# Patient Record
Sex: Female | Born: 1970 | Race: White | Hispanic: Yes | Marital: Married | Smoking: Never smoker
Health system: Southern US, Community
[De-identification: ages and names within clinical notes are randomized; demographics above are authoritative.]

## PROBLEM LIST (undated history)

## (undated) DIAGNOSIS — D259 Leiomyoma of uterus, unspecified: Secondary | ICD-10-CM

## (undated) DIAGNOSIS — M1990 Unspecified osteoarthritis, unspecified site: Secondary | ICD-10-CM

## (undated) DIAGNOSIS — E785 Hyperlipidemia, unspecified: Secondary | ICD-10-CM

## (undated) DIAGNOSIS — L719 Rosacea, unspecified: Secondary | ICD-10-CM

## (undated) HISTORY — PX: BREAST BIOPSY: SHX20

## (undated) HISTORY — DX: Rosacea, unspecified: L71.9

## (undated) HISTORY — PX: BREAST SURGERY: SHX581

## (undated) HISTORY — PX: UTERINE FIBROID EMBOLIZATION: SHX825

## (undated) HISTORY — PX: HAND SURGERY: SHX662

## (undated) HISTORY — DX: Hyperlipidemia, unspecified: E78.5

## (undated) HISTORY — DX: Leiomyoma of uterus, unspecified: D25.9

## (undated) HISTORY — DX: Unspecified osteoarthritis, unspecified site: M19.90

## (undated) HISTORY — PX: FRACTURE SURGERY: SHX138

---

## 2008-06-23 ENCOUNTER — Ambulatory Visit: Payer: Self-pay | Admitting: Gastroenterology

## 2008-06-23 DIAGNOSIS — K3189 Other diseases of stomach and duodenum: Secondary | ICD-10-CM

## 2008-06-23 DIAGNOSIS — R1013 Epigastric pain: Secondary | ICD-10-CM

## 2011-05-09 ENCOUNTER — Ambulatory Visit (INDEPENDENT_AMBULATORY_CARE_PROVIDER_SITE_OTHER): Payer: BC Managed Care – PPO | Admitting: Family Medicine

## 2011-05-09 VITALS — BP 115/76 | HR 84 | Temp 98.3°F | Resp 16 | Ht 62.0 in | Wt 125.0 lb

## 2011-05-09 DIAGNOSIS — E7800 Pure hypercholesterolemia, unspecified: Secondary | ICD-10-CM

## 2011-05-09 DIAGNOSIS — Z0000 Encounter for general adult medical examination without abnormal findings: Secondary | ICD-10-CM

## 2011-05-09 DIAGNOSIS — K602 Anal fissure, unspecified: Secondary | ICD-10-CM

## 2011-05-09 LAB — COMPREHENSIVE METABOLIC PANEL
ALT: 11 U/L (ref 0–35)
AST: 17 U/L (ref 0–37)
CO2: 29 mEq/L (ref 19–32)
Chloride: 103 mEq/L (ref 96–112)
Sodium: 138 mEq/L (ref 135–145)
Total Bilirubin: 0.5 mg/dL (ref 0.3–1.2)
Total Protein: 6.8 g/dL (ref 6.0–8.3)

## 2011-05-09 LAB — CBC
HCT: 46.9 % — ABNORMAL HIGH (ref 36.0–46.0)
Hemoglobin: 15.3 g/dL — ABNORMAL HIGH (ref 12.0–15.0)
MCH: 30.1 pg (ref 26.0–34.0)
MCHC: 32.6 g/dL (ref 30.0–36.0)
MCV: 92.3 fL (ref 78.0–100.0)
Platelets: 355 K/uL (ref 150–400)
RBC: 5.08 MIL/uL (ref 3.87–5.11)
RDW: 13.1 % (ref 11.5–15.5)
WBC: 10.9 K/uL — ABNORMAL HIGH (ref 4.0–10.5)

## 2011-05-09 LAB — LIPID PANEL
Cholesterol: 195 mg/dL (ref 0–200)
LDL Cholesterol: 114 mg/dL — ABNORMAL HIGH (ref 0–99)
VLDL: 29 mg/dL (ref 0–40)

## 2011-05-09 LAB — TSH: TSH: 2.735 u[IU]/mL (ref 0.350–4.500)

## 2011-05-09 NOTE — Progress Notes (Signed)
  Subjective:    Patient ID: [facility], female    DOB: 28-Oct-1970, 41 y.o.   MRN: [id number]  HPI 41 yo healthy female here for physical exam with labwork.  Concerned about cholesterol levels.  1 yr ago at [facility] it was high.  Has been trying to eat less cheese. Does have family history of high cholesterol but denies heart disease.  Fasting today.   Pap smear - 2012 - Mar Mammogram - 2012 - Sep or Oct  C/O: Rectal bleeding/hemorrhoids - also itches.  Uses hydrocortisone ointment and helps the itching.  Does struggle with constipation.  Stools are hard and small pieces, though goes frequently.    Review of Systems Negative except as per HPI     Objective:   Physical Exam  Constitutional: Vital signs are normal. She appears well-developed and well-nourished.  HENT:  Head: Normocephalic.  Right Ear: Hearing, tympanic membrane, external ear and ear canal normal.  Left Ear: Hearing, tympanic membrane, external ear and ear canal normal.  Nose: Nose normal.  Eyes: Conjunctivae, EOM and lids are normal. Pupils are equal, round, and reactive to light.  Neck: No mass and no thyromegaly present.  Cardiovascular: Normal rate, regular rhythm, normal heart sounds, intact distal pulses and normal pulses.   Pulmonary/Chest: Effort normal and breath sounds normal.  Abdominal: Soft. Normal appearance and bowel sounds are normal. She exhibits no distension. There is no hepatosplenomegaly. There is no tenderness. There is no CVA tenderness.  Genitourinary:          Musculoskeletal: Normal range of motion.  Lymphadenopathy:    She has no cervical adenopathy.    She has no axillary adenopathy.  Neurological: She is alert.    Small tear at anus.  No external hemorrhoids      Assessment & Plan:  Physical exam - normal exam.  Routine labs  Anal pruritis/blood - small fissure.  Colace for constipation.  INcrease water consumption.

## 2011-09-23 ENCOUNTER — Ambulatory Visit (INDEPENDENT_AMBULATORY_CARE_PROVIDER_SITE_OTHER): Payer: BC Managed Care – PPO | Admitting: Emergency Medicine

## 2011-09-23 VITALS — BP 118/74 | HR 72 | Temp 98.2°F | Resp 18 | Ht 62.0 in | Wt 132.0 lb

## 2011-09-23 DIAGNOSIS — R300 Dysuria: Secondary | ICD-10-CM

## 2011-09-23 DIAGNOSIS — R350 Frequency of micturition: Secondary | ICD-10-CM

## 2011-09-23 DIAGNOSIS — N3000 Acute cystitis without hematuria: Secondary | ICD-10-CM

## 2011-09-23 LAB — POCT URINALYSIS DIPSTICK
Bilirubin, UA: NEGATIVE
Glucose, UA: 250
Ketones, UA: NEGATIVE
Nitrite, UA: NEGATIVE
Protein, UA: NEGATIVE
Spec Grav, UA: 1.015
Urobilinogen, UA: 0.2
pH, UA: 7

## 2011-09-23 LAB — POCT UA - MICROSCOPIC ONLY
Bacteria, U Microscopic: NEGATIVE
Casts, Ur, LPF, POC: NEGATIVE
Crystals, Ur, HPF, POC: NEGATIVE
Mucus, UA: NEGATIVE
Yeast, UA: NEGATIVE

## 2011-09-23 MED ORDER — PHENAZOPYRIDINE HCL 200 MG PO TABS: 200.0000 mg | ORAL_TABLET | Freq: Three times a day (TID) | ORAL | Status: AC | PRN

## 2011-09-23 MED ORDER — FLUCONAZOLE 150 MG PO TABS: 150.0000 mg | ORAL_TABLET | Freq: Once | ORAL | Status: AC

## 2011-09-23 MED ORDER — SULFAMETHOXAZOLE-TRIMETHOPRIM 800-160 MG PO TABS: 1.0000 | ORAL_TABLET | Freq: Two times a day (BID) | ORAL | Status: AC

## 2011-09-23 NOTE — Patient Instructions (Signed)
Infeccin del tracto urinario (Urinary Tract Infection) Las infecciones en el tracto urinario pueden comenzar en varios lugares. Una infeccin en la vejiga (cistitis), una infeccin en el rin (pielonefritis) o una infeccin en la prstata (prostatitis) son diferentes tipos de infeccin del tracto urinario. Por lo general mejoran si se los trata con antibiticos. Los antibiticos son medicamentos que matan grmenes. Tome todos los medicamentos que le han recetado hasta que se terminen. Podr sentirse bien dentro de unos das, pero DEBE TOMAR LOS MEDICAMENTOS HASTA TERMINAR EL TRATAMIENTO, de lo contrario la infeccin puede no solucionarse y luego ser ms difcil de tratar. INSTRUCCIONES PARA EL CUIDADO DOMICILIARIO  Beba gran cantidad de lquidos para mantener la orina de tono claro o color amarillo plido. Se recomienda especialmente el jugo de arndanos rojos, adems de grandes cantidades de agua.   Evite la cafena, el t y las bebidas con gas. Estas sustancias irritan la vejiga.   El alcohol puede irritar la prstata.   Utilice los medicamentos de venta libre o de prescripcin para el dolor, el malestar o la fiebre, segn se lo indique el profesional que lo asiste.  PARA PREVENIR FUTURAS INFECCIONES:  Vace la vejiga con frecuencia. Evite retener la orina durante largos perodos.   Despus de mover el intestino, las mujeres deben higienizarse la regin perineal desde adelante hacia atrs. Use cada papel tissue slo una vez.   Vace la vejiga antes y despus de tener relaciones sexuales.  OBTENER LOS RESULTADOS DE LAS PRUEBAS Durante su visita no contar con todos los resultados de los anlisis. En este caso, tenga otra entrevista con su mdico para conocerlos. No piense que el resultado es normal si no tiene noticias de su mdico o de la institucin mdica. Es importante el seguimiento de todos los resultados de los anlisis.  SOLICITE ATENCIN MDICA SI:  Siente dolor en la espalda.    El beb tiene ms de 3 meses y su temperatura rectal es de 100.5 F (38.1 C) o ms durante ms de 1 da.   Los problemas (sntomas) no mejoran en 3 das. Solicite atencin mdica antes si empeora.  SOLICITE ATENCIN MDICA DE INMEDIATO SI:  Comienza a sentir un dolor de espaldas o en la zona abdominal inferior intenso.   Comienza a sentir escalofros.   Tiene fiebre.   Su beb tiene ms de 3 meses y su temperatura rectal es de 102 F (38.9 C) o mayor.   Su beb tiene 3 meses o menos y su temperatura rectal es de 100.4 F (38 C) o mayor.   Siente nuseas o vmitos.   Tiene una sensacin continua de quemazn o molestias al orinar.  EST SEGURO QUE:  Comprende las instrucciones para el alta mdica.   Controlar su enfermedad.   Solicitar atencin mdica de inmediato segn las indicaciones.  Document Released: 10/31/2004 Document Revised: 01/10/2011 [employer] Patient Information 2012 [employer]. 

## 2011-09-23 NOTE — Progress Notes (Signed)
   Date:  09/23/2011   Name:  Ariana Edwards   DOB:  05-18-1970   MRN:  [id number] Gender: female  Age: 41 y.o.  PCP:  Adrian Saran, MD    Chief Complaint: Dysuria and Urinary Frequency   History of Present Illness:  Ariana Edwards is a 41 y.o. pleasant patient who presents with the following:  Dysuria, urgency and frequency over past few days.  No fever, chills, nausea, vomiting, vaginal discharge, back pain, abdominal pain.  Chronic dyspareunia.  History of prior UTI and candidal vaginitis.  There is no problem list on file for this patient.   No past medical history on file.  No past surgical history on file.  History  Substance Use Topics  . Smoking status: Never Smoker   . Smokeless tobacco: Not on file  . Alcohol Use: Not on file    No family history on file.  No Known Allergies  Medication list has been reviewed and updated.  No current outpatient prescriptions on file prior to visit.    Review of Systems:  As per HPI, otherwise negative.    Physical Examination: Filed Vitals:   09/23/11 1317  BP: 118/74  Pulse: 72  Temp: 98.2 F (36.8 C)  Resp: 18   Filed Vitals:   09/23/11 1317  Height: 5\' 2"  (1.575 m)  Weight: 132 lb (59.875 kg)   Body mass index is 24.14 kg/(m^2). Ideal Body Weight: Weight in (lb) to have BMI = 25: 136.4    GEN: WDWN, NAD, Non-toxic, Alert & Oriented x 3 HEENT: Atraumatic, Normocephalic.  Ears and Nose: No external deformity. EXTR: No clubbing/cyanosis/edema NEURO: Normal gait.  PSYCH: Normally interactive. Conversant. Not depressed or anxious appearing.  Calm demeanor.  Abdomen soft, not tender, no CVA tenderness  Assessment and Plan: Cystitis Septra DS Pyridium  Follow up as needed  Results for orders placed in visit on 09/23/11  POCT UA - MICROSCOPIC ONLY      Component Value Range   WBC, Ur, HPF, POC 15-20     RBC, urine, microscopic 2-3     Bacteria, U Microscopic neg     Mucus, UA neg      Epithelial cells, urine per micros 0-2     Crystals, Ur, HPF, POC neg     Casts, Ur, LPF, POC neg     Yeast, UA neg    POCT URINALYSIS DIPSTICK      Component Value Range   Color, UA light yellow     Clarity, UA cloudy     Glucose, UA 250     Bilirubin, UA neg     Ketones, UA neg     Spec Grav, UA 1.015     Blood, UA moderate     pH, UA 7.0     Protein, UA neg     Urobilinogen, UA 0.2     Nitrite, UA neg     Leukocytes, UA small (1+)       Carmelina Dane, MD

## 2011-10-02 ENCOUNTER — Ambulatory Visit (INDEPENDENT_AMBULATORY_CARE_PROVIDER_SITE_OTHER): Payer: BC Managed Care – PPO | Admitting: Family Medicine

## 2011-10-02 VITALS — BP 108/64 | HR 88 | Temp 98.3°F | Resp 18 | Ht 62.0 in | Wt 131.4 lb

## 2011-10-02 DIAGNOSIS — R591 Generalized enlarged lymph nodes: Secondary | ICD-10-CM

## 2011-10-02 DIAGNOSIS — M542 Cervicalgia: Secondary | ICD-10-CM

## 2011-10-02 DIAGNOSIS — R599 Enlarged lymph nodes, unspecified: Secondary | ICD-10-CM

## 2011-10-02 NOTE — Progress Notes (Signed)
  [facility]:  Office Visit  Chief Complaint:  Chief Complaint  Patient presents with  . Headache    swelling behind right ear, pain x 3 days    HPI: [address] Ariana Edwards is a 41 y.o. female who complains of  + swelling on right base of head since Monday. Constant pressure, associated with swelling behind the ear along neck, minimal swelling, pressure is described as discomfort and not pain. Has had recent UTI and treated with Bactrim. No prior injuries or new exercises. Denies HA, vision changes, nausea vomiting, confusion, hearing issues, dizziness, gait changes, rashes.     Past Medical History  Diagnosis Date  . Uterine fibroid   . Rosacea    No past surgical history on file. History   Social History  . Marital Status: Single    Spouse Name: N/A    Number of Children: N/A  . Years of Education: N/A   Social History Main Topics  . Smoking status: Never Smoker   . Smokeless tobacco: None  . Alcohol Use: None  . Drug Use: None  . Sexually Active: None   Other Topics Concern  . None   Social History Narrative  . None   No family history on file. No Known Allergies Prior to Admission medications   Medication Sig Start Date End Date Taking? Authorizing Provider  sulfamethoxazole-trimethoprim (BACTRIM DS,SEPTRA DS) 800-160 MG per tablet Take 1 tablet by mouth 2 (two) times daily. 09/23/11 10/03/11 Yes Phillips Odor, MD     ROS: The patient denies fevers, chills, night sweats, unintentional weight loss, chest pain, palpitations, wheezing, dyspnea on exertion, nausea, vomiting, abdominal pain, dysuria, hematuria, melena, numbness, weakness, or tingling.   All other systems have been reviewed and were otherwise negative with the exception of those mentioned in the HPI and as above.    PHYSICAL EXAM: Filed Vitals:   10/02/11 1832  BP: 108/64  Pulse: 88  Temp: 98.3 F (36.8 C)  Resp: 18   Filed Vitals:   10/02/11 1832  Height: 5\' 2"   (1.575 m)  Weight: 131 lb 6.4 oz (59.603 kg)   Body mass index is 24.03 kg/(m^2).  General: Alert, no acute distress HEENT:  Normocephalic, atraumatic, oropharynx patent. + tnederness at base of right lateral occipit where trapezius and capitus msk intersects with boney prominence of occiput Cardiovascular:  Regular rate and rhythm, no rubs murmurs or gallops.  No Carotid bruits, radial pulse intact. No pedal edema.  Respiratory: Clear to auscultation bilaterally.  No wheezes, rales, or rhonchi.  No cyanosis, no use of accessory musculature GI: No organomegaly, abdomen is soft and non-tender, positive bowel sounds.  No masses. Skin: No rashes. Neurologic: Facial musculature symmetric. CN 2-12 grossly intact Psychiatric: Patient is appropriate throughout our interaction. Lymphatic: + small 2-5 mm , tender, mobile post auricular cervical lymphadenopathy on right side  Musculoskeletal: Gait intact.   LABS:    EKG/XRAY:   Primary read interpreted by Dr. Conley Rolls at [facility].   ASSESSMENT/PLAN: Encounter Diagnoses  Name Primary?  Marland Kitchen LAD (lymphadenopathy) Yes  . Cervical muscle pain    LAD secondary to recent infection Occipital cervical msk sprain/strain .  Continue to monitor. Take OTC NSAID. If discomfort does not improve with NSAID or LAD increases in size or have worsening sxs then f/u prn    LE, THAO PHUONG, DO 10/02/2011 7:16 PM

## 2012-03-02 ENCOUNTER — Ambulatory Visit (INDEPENDENT_AMBULATORY_CARE_PROVIDER_SITE_OTHER): Payer: BC Managed Care – PPO | Admitting: Physician Assistant

## 2012-03-02 VITALS — BP 124/88 | HR 80 | Temp 98.1°F | Resp 18 | Ht 61.5 in | Wt 130.0 lb

## 2012-03-02 DIAGNOSIS — L293 Anogenital pruritus, unspecified: Secondary | ICD-10-CM

## 2012-03-02 DIAGNOSIS — R350 Frequency of micturition: Secondary | ICD-10-CM

## 2012-03-02 DIAGNOSIS — R81 Glycosuria: Secondary | ICD-10-CM

## 2012-03-02 DIAGNOSIS — N390 Urinary tract infection, site not specified: Secondary | ICD-10-CM

## 2012-03-02 DIAGNOSIS — N760 Acute vaginitis: Secondary | ICD-10-CM

## 2012-03-02 DIAGNOSIS — N898 Other specified noninflammatory disorders of vagina: Secondary | ICD-10-CM

## 2012-03-02 LAB — POCT UA - MICROSCOPIC ONLY: Crystals, Ur, HPF, POC: NEGATIVE

## 2012-03-02 LAB — POCT WET PREP WITH KOH
KOH Prep POC: NEGATIVE
RBC Wet Prep HPF POC: NEGATIVE
Yeast Wet Prep HPF POC: NEGATIVE

## 2012-03-02 LAB — POCT URINALYSIS DIPSTICK
Bilirubin, UA: NEGATIVE
Glucose, UA: 100
Nitrite, UA: NEGATIVE
Urobilinogen, UA: 0.2

## 2012-03-02 LAB — GLUCOSE, POCT (MANUAL RESULT ENTRY): POC Glucose: 66 mg/dl — AB (ref 70–99)

## 2012-03-02 MED ORDER — METRONIDAZOLE 500 MG PO TABS: 500.0000 mg | ORAL_TABLET | Freq: Two times a day (BID) | ORAL | Status: DC

## 2012-03-02 NOTE — Progress Notes (Signed)
[address], [place] [address]   Phone [phone]  Subjective:    Patient ID: Ariana Edwards, female    DOB: Jul 13, 1970, 42 y.o.   MRN: [id number]  HPI Pt presents to clinic with several day h/o urinary urgency but no dysuria.  She has some vaginal irritation that she took a Diflucan for last night but it did not seem to help and she is worried she might have BV.  She has some abd fullness and discomfort.  She has noticed that for a while she has had some discomfort with sex,  She has had no new sexual partners and they have tried many different positions and she still has pain. Her pain is deep and they do use lubrication.   Review of Systems  Constitutional: Negative for fever and chills.  Gastrointestinal: Positive for abdominal pain (fullness, pressure).  Genitourinary: Positive for urgency, vaginal discharge and vaginal pain. Negative for dysuria, frequency, hematuria and vaginal bleeding (short menses that are regular).  Musculoskeletal: Negative for back pain.       Objective:   Physical Exam  Vitals reviewed. Constitutional: She appears well-developed and well-nourished.  HENT:  Head: Normocephalic and atraumatic.  Right Ear: External ear normal.  Left Ear: External ear normal.  Cardiovascular: Normal rate, regular rhythm and normal heart sounds.   No murmur heard. Pulmonary/Chest: Effort normal and breath sounds normal.  Abdominal: Soft. Bowel sounds are normal. There is no tenderness. There is no CVA tenderness.  Genitourinary: Pelvic exam was performed with patient supine. No labial fusion. There is no rash, tenderness, lesion or injury on the right labia. There is no rash, tenderness, lesion or injury on the left labia. Uterus is enlarged and tender (cervix is incomfrtable with palpation - pt states much like she feels during sex). Uterus is not deviated and not fixed. Cervix exhibits no motion tenderness, no discharge and no friability. Right adnexum  displays no mass, no tenderness and no fullness. Left adnexum displays no mass, no tenderness and no fullness. No erythema, tenderness or bleeding around the vagina. No foreign body around the vagina. No signs of injury around the vagina. Vaginal discharge (thin white d/c) found.    Results for orders placed in visit on 03/02/12  POCT URINALYSIS DIPSTICK      Component Value Range   Color, UA yellow     Clarity, UA hazy     Glucose, UA 100     Bilirubin, UA neg     Ketones, UA neg     Spec Grav, UA 1.015     Blood, UA moderate     pH, UA 5.5     Protein, UA neg     Urobilinogen, UA 0.2     Nitrite, UA neg     Leukocytes, UA Trace    POCT UA - MICROSCOPIC ONLY      Component Value Range   WBC, Ur, HPF, POC 5-7     RBC, urine, microscopic 1-3     Bacteria, U Microscopic 1+     Mucus, UA neg     Epithelial cells, urine per micros 0-1     Crystals, Ur, HPF, POC neg     Casts, Ur, LPF, POC neg     Yeast, UA neg    POCT WET PREP WITH KOH      Component Value Range   Trichomonas, UA Negative     Clue Cells Wet Prep HPF POC 100%     Epithelial  Wet Prep HPF POC 0-5     Yeast Wet Prep HPF POC neg     Bacteria Wet Prep HPF POC 4+     RBC Wet Prep HPF POC neg     WBC Wet Prep HPF POC 0-3     KOH Prep POC Negative    GLUCOSE, POCT (MANUAL RESULT ENTRY)      Component Value Range   POC Glucose 66 (*) 70 - 99 mg/dl       Assessment & Plan:   1. Urinary frequency  POCT urinalysis dipstick, POCT UA - Microscopic Only, Urine culture  2. Vaginal itching  POCT Wet Prep with KOH  3. Glucosuria  POCT glucose (manual entry)  4. BV (bacterial vaginosis)  metroNIDAZOLE (FLAGYL) 500 MG tablet   1- will treat for BV because we see that definitely on wet prep and will send urine culture - there is a possibility that the patient has both infections but the urine is not as clear - pt would like to wait on that treatment until the culture shows definite infection 2- no yeast seen -  3- pt has  had glucose i her last 2 urine samples but her blood glucose is normal 4- treat current  D/w pt that she has a probable fibroid that is causing her pain - she also has a short vaginal canal and if she changes positions to limit her partners depth of penetration that may decrease her pain.  Pt will f/u with Dr Juliene Pina (her GYN) about her possible fibroid.

## 2012-03-05 LAB — URINE CULTURE

## 2012-03-06 MED ORDER — CIPROFLOXACIN HCL 500 MG PO TABS: 500.0000 mg | ORAL_TABLET | Freq: Two times a day (BID) | ORAL | Status: DC

## 2012-03-06 NOTE — Addendum Note (Signed)
Addended by: Morrell Riddle on: 03/06/2012 12:24 PM   Modules accepted: Orders

## 2012-10-14 ENCOUNTER — Ambulatory Visit: Payer: Self-pay | Admitting: *Deleted

## 2012-11-15 ENCOUNTER — Ambulatory Visit (INDEPENDENT_AMBULATORY_CARE_PROVIDER_SITE_OTHER): Payer: BC Managed Care – PPO | Admitting: Physician Assistant

## 2012-11-15 VITALS — BP 104/70 | HR 75 | Temp 97.5°F | Resp 16 | Ht 61.5 in | Wt 131.0 lb

## 2012-11-15 DIAGNOSIS — N390 Urinary tract infection, site not specified: Secondary | ICD-10-CM

## 2012-11-15 DIAGNOSIS — N898 Other specified noninflammatory disorders of vagina: Secondary | ICD-10-CM

## 2012-11-15 DIAGNOSIS — R350 Frequency of micturition: Secondary | ICD-10-CM

## 2012-11-15 DIAGNOSIS — N841 Polyp of cervix uteri: Secondary | ICD-10-CM

## 2012-11-15 LAB — POCT URINALYSIS DIPSTICK
Bilirubin, UA: NEGATIVE
Glucose, UA: 100
Nitrite, UA: NEGATIVE
Spec Grav, UA: <=1.005
pH, UA: 6

## 2012-11-15 LAB — POCT WET PREP WITH KOH

## 2012-11-15 LAB — POCT UA - MICROSCOPIC ONLY
Casts, Ur, LPF, POC: NEGATIVE
Yeast, UA: NEGATIVE

## 2012-11-15 MED ORDER — NITROFURANTOIN MONOHYD MACRO 100 MG PO CAPS: 100.0000 mg | ORAL_CAPSULE | Freq: Two times a day (BID) | ORAL | Status: AC

## 2012-11-15 MED ORDER — FLUCONAZOLE 150 MG PO TABS: 150.0000 mg | ORAL_TABLET | Freq: Once | ORAL | Status: DC

## 2012-11-15 MED ORDER — PHENAZOPYRIDINE HCL 200 MG PO TABS: 200.0000 mg | ORAL_TABLET | Freq: Three times a day (TID) | ORAL | Status: DC | PRN

## 2012-11-15 NOTE — Patient Instructions (Addendum)
Endocervical polyp -- call Dr Juliene Pina to schedule an appt for her to evaluate  If after the antibiotic for the urinary infection you still have vaginal itching please use the diflucan - if the vaginal symptoms continue then return to clinic

## 2012-11-15 NOTE — Progress Notes (Signed)
[address], [place] [address]   Phone [phone]  Subjective:    Patient ID: [facility], female    DOB: August 26, 1970, 42 y.o.   MRN: [id number]  HPI Pt presents to clinic with dysuria and urinary urgency and frequency.  She is having some vaginal discharge but more like mucus but with itching - she used a Diflucan yesterday am but she is still having the itching symptoms.  She has had no change in sexual partners.  Review of Systems  Constitutional: Negative for fever and chills.  Gastrointestinal: Negative for nausea, vomiting and abdominal pain.  Genitourinary: Positive for dysuria, urgency, frequency and vaginal discharge (white - like mucus -- vaginal itching).  Musculoskeletal: Negative for back pain.       Objective:   Physical Exam  Vitals reviewed. Constitutional: She is oriented to person, place, and time. She appears well-developed and well-nourished.  HENT:  Head: Normocephalic and atraumatic.  Right Ear: External ear normal.  Left Ear: External ear normal.  Eyes: Conjunctivae are normal. Pupils are equal, round, and reactive to light.  Cardiovascular: Normal rate, regular rhythm and normal heart sounds.   No murmur heard. Pulmonary/Chest: Effort normal and breath sounds normal.  Abdominal: Soft.  Genitourinary: Vagina normal. No labial fusion. There is no rash, tenderness, lesion or injury on the right labia. There is no rash, tenderness, lesion or injury on the left labia. Uterus is enlarged (slightly). Uterus is not deviated, not fixed and not tender. Cervix exhibits discharge (mucus discharge). Right adnexum displays no mass, no tenderness and no fullness. Left adnexum displays no mass, no tenderness and no fullness.    Neurological: She is alert and oriented to person, place, and time.  Skin: Skin is warm and dry.  Psychiatric: She has a normal mood and affect. Her behavior is normal. Judgment and thought content normal.   Results for orders  placed in visit on 11/15/12  POCT UA - MICROSCOPIC ONLY      Result Value Range   WBC, Ur, HPF, POC tntc     RBC, urine, microscopic tntc     Bacteria, U Microscopic 1+     Mucus, UA trace     Epithelial cells, urine per micros 0-3     Crystals, Ur, HPF, POC neg     Casts, Ur, LPF, POC neg     Yeast, UA neg    POCT URINALYSIS DIPSTICK      Result Value Range   Color, UA lt yellow     Clarity, UA cloudy     Glucose, UA 100     Bilirubin, UA neg     Ketones, UA neg     Spec Grav, UA <=1.005     Blood, UA large     pH, UA 6.0     Protein, UA neg     Urobilinogen, UA 0.2     Nitrite, UA neg     Leukocytes, UA large (3+)    POCT WET PREP WITH KOH      Result Value Range   Trichomonas, UA Negative     Clue Cells Wet Prep HPF POC 0-8     Epithelial Wet Prep HPF POC 6-21     Yeast Wet Prep HPF POC neg     Bacteria Wet Prep HPF POC 3+     RBC Wet Prep HPF POC 0-1     WBC Wet Prep HPF POC 0-3     KOH Prep POC Negative  Assessment & Plan:  Urinary frequency - Plan: POCT UA - Microscopic Only, POCT urinalysis dipstick, Urine culture  Vaginal discharge - Wet prep is neg for yeast but gave her diflucan due to abx for UTI - she will RTC if in 2 weeks she is still having vaginal symptoms after treatment of UTI and yeast vaginitis.  Plan: POCT Wet Prep with KOH, fluconazole (DIFLUCAN) 150 MG tablet  Cervical polyp - pt will call Dr Juliene Pina for eval.  UTI (urinary tract infection) - Treat with abx - pt to push fluids - Plan: nitrofurantoin, macrocrystal-monohydrate, (MACROBID) 100 MG capsule, phenazopyridine (PYRIDIUM) 200 MG tablet  Benny Lennert PA-C 11/15/2012 3:13 PM

## 2013-05-13 ENCOUNTER — Ambulatory Visit (INDEPENDENT_AMBULATORY_CARE_PROVIDER_SITE_OTHER): Payer: BC Managed Care – PPO | Admitting: Physician Assistant

## 2013-05-13 VITALS — BP 106/64 | HR 96 | Temp 98.4°F | Resp 18 | Ht 61.5 in | Wt 129.0 lb

## 2013-05-13 DIAGNOSIS — R0981 Nasal congestion: Secondary | ICD-10-CM

## 2013-05-13 DIAGNOSIS — H6690 Otitis media, unspecified, unspecified ear: Secondary | ICD-10-CM

## 2013-05-13 DIAGNOSIS — J3489 Other specified disorders of nose and nasal sinuses: Secondary | ICD-10-CM

## 2013-05-13 DIAGNOSIS — J309 Allergic rhinitis, unspecified: Secondary | ICD-10-CM

## 2013-05-13 MED ORDER — IPRATROPIUM BROMIDE 0.03 % NA SOLN: 2.0000 | Freq: Two times a day (BID) | NASAL | Status: DC

## 2013-05-13 MED ORDER — AMOXICILLIN 875 MG PO TABS: 875.0000 mg | ORAL_TABLET | Freq: Two times a day (BID) | ORAL | Status: DC

## 2013-05-13 MED ORDER — METHYLPREDNISOLONE (PAK) 4 MG PO TABS: ORAL_TABLET | ORAL | Status: DC

## 2013-05-14 NOTE — Progress Notes (Signed)
   Subjective:    Patient ID: Ariana Edwards, female    DOB: 12-Jun-1970, 43 y.o.   MRN: [id number]  HPI 43 year old female presents for evaluation of acute onset of runny nose, sore throat, PND, itchy, watery eyes, and clear rhinorrhea.  Also complains of right ear pain. Symptoms started yesterday and are worse today. 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  No hx of seasonal allergies - has lived in [place] for 8 years. Her husband does have allergies and takes antihistamines daily in the spring/fall Denies fever, chills, nausea, vomiting, headache, dizziness, cough, SOB, wheezing, or chest pain.   Patient is otherwise doing well with no other concerns today.     Review of Systems  Constitutional: Negative for fever and chills.  HENT: Positive for congestion, ear pain (right), postnasal drip, rhinorrhea, sinus pressure, sneezing and sore throat. Negative for trouble swallowing.   Respiratory: Negative for cough, shortness of breath and wheezing.   Cardiovascular: Negative for chest pain.  Gastrointestinal: Negative for nausea and vomiting.  Neurological: Negative for dizziness and headaches.       Objective:   Physical Exam  Constitutional: She is oriented to person, place, and time. She appears well-developed and well-nourished.  HENT:  Head: Normocephalic and atraumatic.  Right Ear: Hearing, external ear and ear canal normal. Tympanic membrane is erythematous. A middle ear effusion is present.  Left Ear: Hearing, tympanic membrane, external ear and ear canal normal.  Mouth/Throat: Uvula is midline, oropharynx is clear and moist and mucous membranes are normal.  Eyes: Right conjunctiva is injected. Left conjunctiva is injected.  Bilateral watery drainage   Neck: Normal range of motion. Neck supple.  Cardiovascular: Normal rate, regular rhythm and normal heart sounds.   Pulmonary/Chest: Effort normal and breath sounds normal.  Lymphadenopathy:     She has no cervical adenopathy.  Neurological: She is alert and oriented to person, place, and time.  Psychiatric: She has a normal mood and affect. Her behavior is normal. Judgment and thought content normal.          Assessment & Plan:  Allergic rhinitis - Plan: methylPREDNIsolone (MEDROL DOSPACK) 4 MG tablet  Otitis media - Plan: amoxicillin (AMOXIL) 875 MG tablet  Nasal congestion - Plan: methylPREDNIsolone (MEDROL DOSPACK) 4 MG tablet, ipratropium (ATROVENT) 0.03 % nasal spray  Allergic rhinitis with AOM. Will treat ear infection with amoxicillin 875 mg bid x 10 days. Continue OTC Allegra daily. Start medrol dose pack tomorrow. Use atrovent NS twice daily for PND and congestion.  Follow up if symptoms worsen or fail to improve.

## 2013-06-09 ENCOUNTER — Other Ambulatory Visit: Payer: Self-pay | Admitting: Physician Assistant

## 2013-09-10 ENCOUNTER — Ambulatory Visit (INDEPENDENT_AMBULATORY_CARE_PROVIDER_SITE_OTHER): Payer: BC Managed Care – PPO | Admitting: Physician Assistant

## 2013-09-10 VITALS — BP 112/60 | HR 87 | Temp 98.0°F | Resp 16 | Ht 62.0 in | Wt 128.0 lb

## 2013-09-10 DIAGNOSIS — Z131 Encounter for screening for diabetes mellitus: Secondary | ICD-10-CM

## 2013-09-10 DIAGNOSIS — R350 Frequency of micturition: Secondary | ICD-10-CM

## 2013-09-10 DIAGNOSIS — R300 Dysuria: Secondary | ICD-10-CM

## 2013-09-10 DIAGNOSIS — N3000 Acute cystitis without hematuria: Secondary | ICD-10-CM

## 2013-09-10 LAB — POCT URINALYSIS DIPSTICK
Bilirubin, UA: NEGATIVE
Blood, UA: <=1.005
Glucose, UA: 500
Ketones, UA: NEGATIVE
Nitrite, UA: NEGATIVE
Protein, UA: NEGATIVE
Urobilinogen, UA: 0.2
pH, UA: 5.5

## 2013-09-10 LAB — POCT UA - MICROSCOPIC ONLY
Bacteria, U Microscopic: NEGATIVE
Casts, Ur, LPF, POC: NEGATIVE
Crystals, Ur, HPF, POC: NEGATIVE
Mucus, UA: NEGATIVE
Yeast, UA: NEGATIVE

## 2013-09-10 LAB — POCT GLYCOSYLATED HEMOGLOBIN (HGB A1C): Hemoglobin A1C: 5.8

## 2013-09-10 LAB — GLUCOSE, POCT (MANUAL RESULT ENTRY): POC Glucose: 83 mg/dl (ref 70–99)

## 2013-09-10 MED ORDER — CIPROFLOXACIN HCL 250 MG PO TABS: 250.0000 mg | ORAL_TABLET | Freq: Two times a day (BID) | ORAL | Status: DC

## 2013-09-10 NOTE — Progress Notes (Signed)
   Subjective:    Patient ID: [facility], female    DOB: 26-Sep-1970, 43 y.o.   MRN: [id number]  HPI 43 year old female presents for evaluation of acute onset of urinary frequency, dysuria, and urgency. States symptoms started yesterday and have now worsened.  Also admits to bad odor to her urine and some vaginal itching. Denies nausea, vomiting, fever, chills, flank pain, hematuria, or vaginal discharge. Hx of UTI's in the past with last 10/14.  Patient is otherwise doing well with no other concerns today.    Review of Systems  Constitutional: Negative for fever and chills.  Gastrointestinal: Positive for abdominal pain (lower). Negative for nausea and vomiting.  Genitourinary: Positive for dysuria, urgency and frequency. Negative for flank pain, vaginal discharge and menstrual problem.  Neurological: Negative for headaches.       Objective:   Physical Exam  Constitutional: She is oriented to person, place, and time.  HENT:  Head: Normocephalic and atraumatic.  Right Ear: External ear normal.  Left Ear: External ear normal.  Eyes: Conjunctivae are normal.  Neck: Normal range of motion.  Cardiovascular: Normal rate.   Pulmonary/Chest: Effort normal.  Abdominal: Bowel sounds are normal. There is tenderness in the suprapubic area. There is rebound. There is no guarding and no CVA tenderness.  Neurological: She is alert and oriented to person, place, and time.  Psychiatric: She has a normal mood and affect. Her behavior is normal. Judgment and thought content normal.     Results for orders placed in visit on 09/10/13  POCT URINALYSIS DIPSTICK      Result Value Ref Range   Color, UA yellow     Clarity, UA cloudy     Glucose, UA 500     Bilirubin, UA neg     Ketones, UA neg     Spec Grav, UA       Blood, UA <=1.005     pH, UA 5.5     Protein, UA neg     Urobilinogen, UA 0.2     Nitrite, UA neg     Leukocytes, UA moderate (2+)    POCT UA - MICROSCOPIC ONLY   Result Value Ref Range   WBC, Ur, HPF, POC 20-25     RBC, urine, microscopic 0-3     Bacteria, U Microscopic neg     Mucus, UA neg     Epithelial cells, urine per micros 0-4     Crystals, Ur, HPF, POC neg     Casts, Ur, LPF, POC neg     Yeast, UA neg    GLUCOSE, POCT (MANUAL RESULT ENTRY)      Result Value Ref Range   POC Glucose 83  70 - 99 mg/dl  POCT GLYCOSYLATED HEMOGLOBIN (HGB A1C)      Result Value Ref Range   Hemoglobin A1C 5.8          Assessment & Plan:   Dysuria - Plan: POCT urinalysis dipstick, POCT UA - Microscopic Only  Screening for diabetes mellitus - Plan: POCT glucose (manual entry), POCT glycosylated hemoglobin (Hb A1C)  Urinary frequency  Acute cystitis without hematuria - Plan: Urine culture  Will treat for UTI with Cipro 250 mg bid x 5 days. Urine culture sent Push fluids.  Glucose and HgA1c normal which is reassuring. Patient will let me know if she wants a Nephrology eval.  Follow up if symptoms worsening or fail to improve.

## 2013-09-12 LAB — URINE CULTURE: Colony Count: 45000

## 2014-04-12 ENCOUNTER — Other Ambulatory Visit: Payer: Self-pay | Admitting: Obstetrics & Gynecology

## 2014-04-12 DIAGNOSIS — D259 Leiomyoma of uterus, unspecified: Secondary | ICD-10-CM

## 2014-04-12 DIAGNOSIS — D219 Benign neoplasm of connective and other soft tissue, unspecified: Secondary | ICD-10-CM

## 2014-05-04 ENCOUNTER — Other Ambulatory Visit: Payer: Self-pay | Admitting: Obstetrics & Gynecology

## 2014-05-04 ENCOUNTER — Ambulatory Visit
Admission: RE | Admit: 2014-05-04 | Discharge: 2014-05-04 | Disposition: A | Discharge status: Home / Self Care | Payer: BC Managed Care – PPO | Source: Ambulatory Visit | Attending: Obstetrics & Gynecology | Admitting: Obstetrics & Gynecology

## 2014-05-04 DIAGNOSIS — D259 Leiomyoma of uterus, unspecified: Secondary | ICD-10-CM

## 2014-05-04 HISTORY — PX: IR GENERIC HISTORICAL: IMG1180011

## 2014-05-04 NOTE — Consult Note (Signed)
Chief Complaint: Chief Complaint  Patient presents with  . Advice Only    Consult for [place]      Referring Physician(s): Mody,Vaishali  History of Present Illness: Ariana Edwards is a 44 y.o. female story of pelvic cramping and pain secondary to fibroids. In worsening over the last year. She is gravida 0 para 0. She describes cramping during and between her menstrual periods. She also complains of bloating and has a constant awareness of her fibroids. She complains that her stomach is distended. She also has occasional stress incontinence. She denies any abnormal menstrual bleeding. She also complains of dyspareunia. Regarding pregnancy, she is considering pregnancy and has agreed to hold off for 6 months after treatment, should she proceed. She denies any PID, pelvic radiation, or pelvic malignancy.  Past Medical History  Diagnosis Date  . Uterine fibroid   . Rosacea   . Hyperlipidemia     Past Surgical History  Procedure Laterality Date  . Breast surgery    . Fracture surgery      Allergies: Sulfa antibiotics  Medications: Prior to Admission medications   Medication Sig Start Date End Date Taking? Authorizing Provider  ciprofloxacin (CIPRO) 250 MG tablet Take 1 tablet (250 mg total) by mouth 2 (two) times daily. Patient not taking: Reported on 05/04/2014 09/10/13   Marsh Dolly Marte, PA-C  ipratropium (ATROVENT) 0.03 % nasal spray PLACE 2 SPRAYS INTO THE NOSE 2 (TWO) TIMES DAILY. Patient not taking: Reported on 05/04/2014    Collene Leyden, PA-C  methylPREDNIsolone (MEDROL [facility]) 4 MG tablet follow package directions Patient not taking: Reported on 05/04/2014 05/13/13   Collene Leyden, PA-C     Family History  Problem Relation Age of Onset  . Hypertension Mother   . Hypothyroidism Sister     History   Social History  . Marital Status: Single    Spouse Name: N/A  . Number of Children: N/A  . Years of Education: N/A   Social History Main Topics  .  Smoking status: Never Smoker   . Smokeless tobacco: Not on file  . Alcohol Use: Not on file  . Drug Use: Not on file  . Sexual Activity: Not on file   Other Topics Concern  . Not on file   Social History Narrative  . No narrative on file     Review of Systems: A 12 point ROS discussed and pertinent positives are indicated in the HPI above.  All other systems are negative.  Review of Systems  Vital Signs: BP 127/69 mmHg  Pulse 77  Temp(Src) 97.6 F (36.4 C) (Oral)  Resp 14  Ht 5\' 2"  (1.575 m)  Wt 124 lb (56.246 kg)  BMI 22.67 kg/m2  SpO2 99%  LMP 04/19/2014 (Approximate)  Physical Exam  Mallampati Score:     Imaging: No results found.  Ultrasound performed at an outpatient facility dated 10/29/2013 demonstrates multiple uterine fibroids, the largest of which measures greater than 6 cm.  Labs:  CBC: No results for input(s): WBC, HGB, HCT, PLT in the last 8760 hours.  COAGS: No results for input(s): INR, APTT in the last 8760 hours.  BMP: No results for input(s): NA, K, CL, CO2, GLUCOSE, BUN, CALCIUM, CREATININE, GFRNONAA, GFRAA in the last 8760 hours.  Invalid input(s): CMP  LIVER FUNCTION TESTS: No results for input(s): BILITOT, AST, ALT, ALKPHOS, PROT, ALBUMIN in the last 8760 hours.  TUMOR MARKERS: No results for input(s): AFPTM, CEA, CA199, CHROMGRNA in the  last 8760 hours.  Assessment and Plan:  [name] is a candidate for uterine fibroid embolization, given her symptoms of pelvic cramping, stress incontinence, and bulk symptoms. She has had a recent ultrasound but will require an MRI with contrast. She has had an MRI of her breast with contrast in the past and did not have problems. Her questions were answered. She will not need an endometrial biopsy.  Thank you for this interesting consult.  I greatly enjoyed meeting Ariana Edwards and look forward to participating in their care.  Signed: Sulayman Manning, ART A 05/04/2014, 3:42  PM   I spent a total of  45 Minutes   in face to face in clinical consultation, greater than 50% of which was counseling/coordinating care for uterine fibroid embolization.

## 2014-05-15 ENCOUNTER — Ambulatory Visit
Admission: RE | Admit: 2014-05-15 | Discharge: 2014-05-15 | Disposition: A | Discharge status: Home / Self Care | Payer: BC Managed Care – PPO | Source: Ambulatory Visit | Attending: Obstetrics & Gynecology | Admitting: Obstetrics & Gynecology

## 2014-05-15 DIAGNOSIS — D259 Leiomyoma of uterus, unspecified: Secondary | ICD-10-CM

## 2014-05-15 MED ORDER — GADOBENATE DIMEGLUMINE 529 MG/ML IV SOLN
10.0000 mL | Freq: Once | INTRAVENOUS | Status: AC | PRN
  Administered 2014-05-15: 10 mL via INTRAVENOUS

## 2014-07-18 ENCOUNTER — Other Ambulatory Visit: Payer: Self-pay | Admitting: Radiology

## 2014-07-19 ENCOUNTER — Other Ambulatory Visit: Payer: Self-pay | Admitting: Radiology

## 2014-07-20 ENCOUNTER — Observation Stay (HOSPITAL_COMMUNITY)
Admission: RE | Admit: 2014-07-20 | Discharge: 2014-07-21 | Disposition: A | Discharge status: Home / Self Care | Payer: BC Managed Care – PPO | Source: Ambulatory Visit | Attending: Interventional Radiology | Admitting: Interventional Radiology

## 2014-07-20 ENCOUNTER — Other Ambulatory Visit: Payer: Self-pay | Admitting: Interventional Radiology

## 2014-07-20 ENCOUNTER — Ambulatory Visit (HOSPITAL_COMMUNITY)
Admission: RE | Admit: 2014-07-20 | Discharge: 2014-07-20 | Disposition: A | Discharge status: Home / Self Care | Payer: BC Managed Care – PPO | Source: Ambulatory Visit | Attending: Interventional Radiology | Admitting: Interventional Radiology

## 2014-07-20 ENCOUNTER — Encounter (HOSPITAL_COMMUNITY): Payer: Self-pay

## 2014-07-20 VITALS — BP 114/71 | HR 98 | Temp 98.4°F | Resp 20 | Ht 62.0 in | Wt 120.0 lb

## 2014-07-20 DIAGNOSIS — D219 Benign neoplasm of connective and other soft tissue, unspecified: Secondary | ICD-10-CM

## 2014-07-20 DIAGNOSIS — R32 Unspecified urinary incontinence: Secondary | ICD-10-CM | POA: Insufficient documentation

## 2014-07-20 DIAGNOSIS — Z872 Personal history of diseases of the skin and subcutaneous tissue: Secondary | ICD-10-CM | POA: Diagnosis not present

## 2014-07-20 DIAGNOSIS — Z792 Long term (current) use of antibiotics: Secondary | ICD-10-CM | POA: Diagnosis not present

## 2014-07-20 DIAGNOSIS — Z79899 Other long term (current) drug therapy: Secondary | ICD-10-CM | POA: Insufficient documentation

## 2014-07-20 DIAGNOSIS — E785 Hyperlipidemia, unspecified: Secondary | ICD-10-CM | POA: Insufficient documentation

## 2014-07-20 DIAGNOSIS — D259 Leiomyoma of uterus, unspecified: Secondary | ICD-10-CM | POA: Diagnosis not present

## 2014-07-20 LAB — CBC WITH DIFFERENTIAL/PLATELET
Basophils Absolute: 0.1 10*3/uL (ref 0.0–0.1)
Basophils Relative: 1 % (ref 0–1)
EOS ABS: 0.4 10*3/uL (ref 0.0–0.7)
EOS PCT: 4 % (ref 0–5)
HCT: 43.4 % (ref 36.0–46.0)
HEMOGLOBIN: 14.5 g/dL (ref 12.0–15.0)
LYMPHS ABS: 3.2 10*3/uL (ref 0.7–4.0)
Lymphocytes Relative: 36 % (ref 12–46)
MCH: 29.5 pg (ref 26.0–34.0)
MCHC: 33.4 g/dL (ref 30.0–36.0)
MCV: 88.4 fL (ref 78.0–100.0)
MONOS PCT: 5 % (ref 3–12)
Monocytes Absolute: 0.4 10*3/uL (ref 0.1–1.0)
Neutro Abs: 4.9 10*3/uL (ref 1.7–7.7)
Neutrophils Relative %: 54 % (ref 43–77)
Platelets: 275 10*3/uL (ref 150–400)
RBC: 4.91 MIL/uL (ref 3.87–5.11)
RDW: 13.1 % (ref 11.5–15.5)
WBC: 8.9 10*3/uL (ref 4.0–10.5)

## 2014-07-20 LAB — BASIC METABOLIC PANEL
Anion gap: 8 (ref 5–15)
BUN: 13 mg/dL (ref 6–20)
CO2: 25 mmol/L (ref 22–32)
Calcium: 9.2 mg/dL (ref 8.9–10.3)
Chloride: 103 mmol/L (ref 101–111)
Creatinine, Ser: 0.77 mg/dL (ref 0.44–1.00)
GFR calc Af Amer: >60 mL/min (ref >60)
GFR calc non Af Amer: >60 mL/min (ref >60)
GLUCOSE: 98 mg/dL (ref 65–99)
POTASSIUM: 4 mmol/L (ref 3.5–5.1)
SODIUM: 136 mmol/L (ref 135–145)

## 2014-07-20 LAB — PROTIME-INR
INR: 0.95 (ref 0.00–1.49)
Prothrombin Time: 12.9 seconds (ref 11.6–15.2)

## 2014-07-20 LAB — HCG, SERUM, QUALITATIVE: Preg, Serum: NEGATIVE

## 2014-07-20 MED ORDER — DIPHENHYDRAMINE HCL 50 MG/ML IJ SOLN
INTRAMUSCULAR | Status: AC
  Filled 2014-07-20: qty 1

## 2014-07-20 MED ORDER — KETOROLAC TROMETHAMINE 30 MG/ML IJ SOLN
INTRAMUSCULAR | Status: AC
  Filled 2014-07-20: qty 1

## 2014-07-20 MED ORDER — PROMETHAZINE HCL 25 MG RE SUPP: 25.0000 mg | Freq: Three times a day (TID) | RECTAL | Status: DC | PRN

## 2014-07-20 MED ORDER — HYDROMORPHONE 0.3 MG/ML IV SOLN
INTRAVENOUS | Status: AC
  Filled 2014-07-20: qty 25

## 2014-07-20 MED ORDER — SODIUM CHLORIDE 0.9 % IV SOLN: 250.0000 mL | INTRAVENOUS | Status: DC | PRN

## 2014-07-20 MED ORDER — IOHEXOL 300 MG/ML  SOLN
45.0000 mL | Freq: Once | INTRAMUSCULAR | Status: AC | PRN
  Administered 2014-07-20: 45 mL via INTRAVENOUS

## 2014-07-20 MED ORDER — PROMETHAZINE HCL 25 MG PO TABS
25.0000 mg | ORAL_TABLET | Freq: Three times a day (TID) | ORAL | Status: DC | PRN
  Administered 2014-07-20: 25 mg via ORAL
  Filled 2014-07-20: qty 1

## 2014-07-20 MED ORDER — IOHEXOL 300 MG/ML  SOLN
47.0000 mL | Freq: Once | INTRAMUSCULAR | Status: AC | PRN
  Administered 2014-07-20: 47 mL via INTRA_ARTERIAL

## 2014-07-20 MED ORDER — DIPHENHYDRAMINE HCL 50 MG/ML IJ SOLN: 12.5000 mg | Freq: Four times a day (QID) | INTRAMUSCULAR | Status: DC | PRN

## 2014-07-20 MED ORDER — NITROGLYCERIN 1 MG/10 ML FOR IR/CATH LAB
INTRA_ARTERIAL | Status: AC | PRN
  Administered 2014-07-20 (×2): 100 ug via INTRA_ARTERIAL

## 2014-07-20 MED ORDER — ONDANSETRON HCL 4 MG/2ML IJ SOLN
4.0000 mg | Freq: Four times a day (QID) | INTRAMUSCULAR | Status: DC | PRN
  Administered 2014-07-20 (×2): 4 mg via INTRAVENOUS
  Filled 2014-07-20: qty 2

## 2014-07-20 MED ORDER — HYDROMORPHONE HCL 1 MG/ML IJ SOLN
INTRAMUSCULAR | Status: AC | PRN
  Administered 2014-07-20 (×2): 1 mg via INTRAVENOUS

## 2014-07-20 MED ORDER — ONDANSETRON HCL 4 MG/2ML IJ SOLN: 4.0000 mg | Freq: Four times a day (QID) | INTRAMUSCULAR | Status: DC | PRN

## 2014-07-20 MED ORDER — HYDROMORPHONE HCL 2 MG/ML IJ SOLN
INTRAMUSCULAR | Status: AC
  Filled 2014-07-20: qty 1

## 2014-07-20 MED ORDER — IOHEXOL 300 MG/ML  SOLN
20.0000 mL | Freq: Once | INTRAMUSCULAR | Status: AC | PRN
  Administered 2014-07-20: 20 mL via INTRA_ARTERIAL

## 2014-07-20 MED ORDER — DOCUSATE SODIUM 100 MG PO CAPS
100.0000 mg | ORAL_CAPSULE | Freq: Two times a day (BID) | ORAL | Status: DC
  Administered 2014-07-20 (×2): 100 mg via ORAL
  Filled 2014-07-20 (×4): qty 1

## 2014-07-20 MED ORDER — FENTANYL CITRATE (PF) 100 MCG/2ML IJ SOLN
INTRAMUSCULAR | Status: AC | PRN
Start: 2014-07-20 — End: 2014-07-20
  Administered 2014-07-20 (×3): 25 ug via INTRAVENOUS

## 2014-07-20 MED ORDER — SODIUM CHLORIDE 0.9 % IV SOLN
INTRAVENOUS | Status: DC
  Administered 2014-07-20: 08:00:00 via INTRAVENOUS

## 2014-07-20 MED ORDER — SODIUM CHLORIDE 0.9 % IJ SOLN: 9.0000 mL | INTRAMUSCULAR | Status: DC | PRN

## 2014-07-20 MED ORDER — SODIUM CHLORIDE 0.9 % IJ SOLN: 3.0000 mL | Freq: Two times a day (BID) | INTRAMUSCULAR | Status: DC

## 2014-07-20 MED ORDER — NALOXONE HCL 0.4 MG/ML IJ SOLN: 0.4000 mg | INTRAMUSCULAR | Status: DC | PRN

## 2014-07-20 MED ORDER — KETOROLAC TROMETHAMINE 30 MG/ML IJ SOLN
30.0000 mg | Freq: Once | INTRAMUSCULAR | Status: AC
  Administered 2014-07-20: 30 mg via INTRAVENOUS

## 2014-07-20 MED ORDER — DIPHENHYDRAMINE HCL 12.5 MG/5ML PO ELIX: 12.5000 mg | ORAL_SOLUTION | Freq: Four times a day (QID) | ORAL | Status: DC | PRN

## 2014-07-20 MED ORDER — CEFAZOLIN SODIUM-DEXTROSE 2-3 GM-% IV SOLR
2.0000 g | Freq: Once | INTRAVENOUS | Status: AC
  Administered 2014-07-20: 2 g via INTRAVENOUS

## 2014-07-20 MED ORDER — FENTANYL CITRATE (PF) 100 MCG/2ML IJ SOLN
INTRAMUSCULAR | Status: AC
  Filled 2014-07-20: qty 4

## 2014-07-20 MED ORDER — LIDOCAINE HCL 1 % IJ SOLN
INTRAMUSCULAR | Status: AC
  Filled 2014-07-20: qty 20

## 2014-07-20 MED ORDER — HYDROMORPHONE 0.3 MG/ML IV SOLN: INTRAVENOUS | Status: DC

## 2014-07-20 MED ORDER — MORPHINE SULFATE 1 MG/ML IV SOLN
INTRAVENOUS | Status: DC
  Administered 2014-07-20 (×2): via INTRAVENOUS
  Administered 2014-07-20: 17.47 mg via INTRAVENOUS
  Administered 2014-07-21: 10.7 mg via INTRAVENOUS
  Administered 2014-07-21: 5.3 mg via INTRAVENOUS
  Administered 2014-07-21: 0.5 mg via INTRAVENOUS
  Filled 2014-07-20 (×2): qty 25

## 2014-07-20 MED ORDER — SODIUM CHLORIDE 0.9 % IV SOLN
INTRAVENOUS | Status: DC
  Administered 2014-07-20: 16:00:00 via INTRAVENOUS

## 2014-07-20 MED ORDER — SODIUM CHLORIDE 0.9 % IJ SOLN: 3.0000 mL | INTRAMUSCULAR | Status: DC | PRN

## 2014-07-20 MED ORDER — PREDNISONE 20 MG PO TABS
20.0000 mg | ORAL_TABLET | Freq: Once | ORAL | Status: DC
  Filled 2014-07-20: qty 1

## 2014-07-20 MED ORDER — IOHEXOL 300 MG/ML  SOLN: 20.0000 mL | Freq: Once | INTRAMUSCULAR | Status: DC | PRN

## 2014-07-20 MED ORDER — PANTOPRAZOLE SODIUM 40 MG IV SOLR
40.0000 mg | Freq: Two times a day (BID) | INTRAVENOUS | Status: DC
  Administered 2014-07-20: 40 mg via INTRAVENOUS
  Filled 2014-07-20 (×3): qty 40

## 2014-07-20 MED ORDER — CEFAZOLIN SODIUM-DEXTROSE 2-3 GM-% IV SOLR
INTRAVENOUS | Status: AC
  Filled 2014-07-20: qty 50

## 2014-07-20 MED ORDER — ONDANSETRON HCL 4 MG/2ML IJ SOLN
4.0000 mg | Freq: Four times a day (QID) | INTRAMUSCULAR | Status: DC | PRN
  Filled 2014-07-20: qty 2

## 2014-07-20 MED ORDER — MIDAZOLAM HCL 2 MG/2ML IJ SOLN
INTRAMUSCULAR | Status: AC | PRN
  Administered 2014-07-20 (×2): 0.5 mg via INTRAVENOUS
  Administered 2014-07-20: 1 mg via INTRAVENOUS
  Administered 2014-07-20 (×2): 0.5 mg via INTRAVENOUS

## 2014-07-20 MED ORDER — MIDAZOLAM HCL 2 MG/2ML IJ SOLN
INTRAMUSCULAR | Status: AC
  Filled 2014-07-20: qty 6

## 2014-07-20 MED ORDER — IBUPROFEN 800 MG PO TABS
800.0000 mg | ORAL_TABLET | Freq: Four times a day (QID) | ORAL | Status: DC
  Administered 2014-07-20: 800 mg via ORAL
  Filled 2014-07-20: qty 1

## 2014-07-20 MED ORDER — DIPHENHYDRAMINE HCL 50 MG/ML IJ SOLN
50.0000 mg | Freq: Once | INTRAMUSCULAR | Status: AC
  Administered 2014-07-20: 50 mg via INTRAVENOUS

## 2014-07-20 NOTE — Procedures (Signed)
B [place] 5-700: 3 vials 7-900: 1/10 vial EBL none Comp none

## 2014-07-20 NOTE — H&P (Signed)
Referring Physician(s): Hoss,Arthur  History of Present Illness: Ariana Edwards is a 44 y.o. female with symptomatic uterine fibroids with c/o bloating, cramping and urinary incontinence. She denies any menorrhagia. She has been seen in consult on 05/04/14 with a MRI on 05/16/14 and has been deemed a candidate for UFE. She has been scheduled today for image guided uterine fibroid embolization. She denies any chest pain, shortness of breath or palpitations. She denies any active signs of bleeding or excessive bruising. She denies any recent fever, chills or urinary symptoms. She denies any current nausea or vomiting. She does admit to lower pelvic bloating and slight pain. The patient denies any history of sleep apnea or chronic oxygen use. She denies any known complications to sedation, but has never had it.      Past Medical History  Diagnosis Date  . Uterine fibroid   . Rosacea   . Hyperlipidemia     Past Surgical History  Procedure Laterality Date  . Breast surgery    . Fracture surgery      Allergies: Sulfa antibiotics  Medications: Prior to Admission medications   Medication Sig Start Date End Date Taking? Authorizing Provider  ciprofloxacin (CIPRO) 250 MG tablet Take 1 tablet (250 mg total) by mouth 2 (two) times daily. Patient not taking: Reported on 05/04/2014 09/10/13   Marsh Dolly Marte, PA-C  ipratropium (ATROVENT) 0.03 % nasal spray PLACE 2 SPRAYS INTO THE NOSE 2 (TWO) TIMES DAILY. Patient not taking: Reported on 05/04/2014    Collene Leyden, PA-C  methylPREDNIsolone (MEDROL [facility]) 4 MG tablet follow package directions Patient not taking: Reported on 05/04/2014 05/13/13   Collene Leyden, PA-C     Family History  Problem Relation Age of Onset  . Hypertension Mother   . Hypothyroidism Sister     History   Social History  . Marital Status: Single    Spouse Name: N/A  . Number of Children: N/A  . Years of Education: N/A   Social History Main Topics  .  Smoking status: Never Smoker   . Smokeless tobacco: Not on file  . Alcohol Use: Not on file  . Drug Use: Not on file  . Sexual Activity: Not on file   Other Topics Concern  . None   Social History Narrative   Review of Systems: A 12 point ROS discussed and pertinent positives are indicated in the HPI above.  All other systems are negative.  Review of Systems  Vital Signs: BP 121/70 mmHg  Pulse 82  Temp(Src) 97.5 F (36.4 C) (Oral)  Resp 20  SpO2 100%  LMP 04/19/2014 (Approximate)  Physical Exam  Constitutional: She is oriented to person, place, and time. No distress.  HENT:  Head: Normocephalic and atraumatic.  Neck: No tracheal deviation present.  Cardiovascular: Normal rate, regular rhythm and intact distal pulses.  Exam reveals no gallop and no friction rub.   No murmur heard. Pulmonary/Chest: Effort normal and breath sounds normal. No respiratory distress. She has no wheezes. She has no rales.  Abdominal: Soft. Bowel sounds are normal. She exhibits distension. There is tenderness.  Lower pelvic TTP  Neurological: She is alert and oriented to person, place, and time.  Skin: She is not diaphoretic.    Mallampati Score:  MD Evaluation Airway: WNL Heart: WNL Abdomen: WNL Chest/ Lungs: WNL ASA  Classification: 3 Mallampati/Airway Score: Two  Imaging: No results found.  Labs:  CBC:  Recent Labs  07/20/14 0820  WBC 8.9  HGB 14.5  HCT 43.4  PLT 275    COAGS:  Recent Labs  07/20/14 0820  INR 0.95    BMP:  Recent Labs  07/20/14 0820  NA 136  K 4.0  CL 103  CO2 25  GLUCOSE 98  BUN 13  CALCIUM 9.2  CREATININE 0.77  GFRNONAA >60  GFRAA >60   Assessment and Plan: Symptomatic uterine fibroids with c/o bloating, cramping and urinary incontinence  Seen in consult on 05/04/14 MRI 05/16/14, reviewed with Dr. Barbie Banner Scheduled today for image guided uterine fibroid embolization  The patient has been NPO, no blood thinners taken, labs and vitals  have been reviewed. Risks and Benefits discussed with the patient including, but not limited to bleeding, infection, vascular injury, and post embolization syndrome.  All of the patient's questions were answered, patient is agreeable to proceed. Consent signed and in chart.   SignedHedy Jacob 07/20/2014, 9:32 AM

## 2014-07-20 NOTE — Sedation Documentation (Signed)
Pt experienced allergic reaction to unknown medication during procedure. Reaction was redness and hives around abdominal area. Dr. Barbie Banner was notified and orders were given to administer 50mg  of benadryl for reaction. 2mg  of Dilaudid was administered at end of procedure. Although reaction was unknown, just as a precautionary measure PCA pump was changed from reduced dose Dilaudid to full dose Morphine.

## 2014-07-20 NOTE — Progress Notes (Signed)
Subjective: Patient is s/p UFE today. She c/o lower pelvic pain. She denies any nausea or right groin site pain.   Allergies: Sulfa antibiotics  Medications: Prior to Admission medications   Medication Sig Start Date End Date Taking? Authorizing Provider  ciprofloxacin (CIPRO) 250 MG tablet Take 1 tablet (250 mg total) by mouth 2 (two) times daily. Patient not taking: Reported on 05/04/2014 09/10/13   Marsh Dolly Marte, PA-C  ipratropium (ATROVENT) 0.03 % nasal spray PLACE 2 SPRAYS INTO THE NOSE 2 (TWO) TIMES DAILY. Patient not taking: Reported on 05/04/2014    Collene Leyden, PA-C  methylPREDNIsolone (MEDROL [facility]) 4 MG tablet follow package directions Patient not taking: Reported on 05/04/2014 05/13/13   Collene Leyden, PA-C   Vital Signs: BP 145/87 mmHg  Pulse 96  Temp(Src) 97.3 F (36.3 C) (Oral)  Resp 12  Ht 5\' 2"  (1.575 m)  Wt 120 lb (54.432 kg)  BMI 21.94 kg/m2  SpO2 96%  LMP 04/19/2014 (Approximate)  Physical Exam General: Sleeping intermittently Abd: Soft, NT, ND Ext: RCFA access site dressing C/D/I, soft, NT, no signs of bleeding or hematoma, DP 1+  Imaging: Ir Angiogram Pelvis Selective Or Supraselective  07/20/2014   CLINICAL DATA:  Pelvic cramping.  Uterine fibroids.  EXAM: UTERINE ARTERY EMBOLIZATION  FLUOROSCOPY TIME:  24 minutes and 42 seconds  MEDICATIONS AND MEDICAL HISTORY: Versed 3 mg, Fentanyl 75 mcg.  Additional Medications: Toradol 30 mg. Dilaudid 2 mg. nitroglycerin 200 mcg. Benadryl 50 mg.  Allergies: Sulfa  ANESTHESIA/SEDATION: Moderate sedation time: 77 minutes  CONTRAST:  112 cc Omnipaque 300  PROCEDURE: Vessels selected:  Bilateral third order iliac artery.  The procedure, risks, benefits, and alternatives were explained to the patient. Questions regarding the procedure were encouraged and answered. The patient understands and consents to the procedure.  The right groin was prepped with Betadine in a sterile fashion, and a sterile drape was applied  covering the operative field. A sterile gown and sterile gloves were used for the procedure.  Under sonographic guidance, a micropuncture needle was inserted into the right common femoral artery and removed over a 018 wire which was up sized to a Bentson. A 5-French sheath was inserted. Cobra II catheter was advanced into the aorta. The contralateral left common iliac artery was selected. The internal iliac artery on the left was selected. 3T Angiography was performed. A micro catheter was advanced over an 018 glide wire into the left uterine artery. Angiography was performed.  Embolization was performed utilizing1 and 1/3 vial 500-700 micron embospheres.  The micro catheter was removed and flushed. A Waltman's loop was created. The ipsilateral right common iliac artery and right internal iliac artery were selected. Angiography was performed. A micro catheter was advanced over a 0018 glide wire into the right uterine artery. Angiography was performed.  Embolization was again performed with1-2/3 vial 500-700 micron embospheres.  The micro catheter and Cobra catheter were removed. Right femoral angiography was performed. A STARCLOSE device was deployed without complication and hemostasis was achieved.  FINDINGS: Imaging demonstrates bilateral pelvic anatomy and bilateral uterine artery angiography. Expected anatomy is identified. No blood supply from the uterine artery to the vagina is visualized.  Post embolization angiography demonstrates relative stasis of flow and pruning of the vasculature to the uterus.  COMPLICATIONS: None  IMPRESSION: Successful bilateral uterine artery embolization.   Electronically Signed   By: Marybelle Killings M.D.   On: 07/20/2014 13:42   Ir Angiogram Pelvis Selective Or Supraselective  07/20/2014  CLINICAL DATA:  Pelvic cramping.  Uterine fibroids.  EXAM: UTERINE ARTERY EMBOLIZATION  FLUOROSCOPY TIME:  24 minutes and 42 seconds  MEDICATIONS AND MEDICAL HISTORY: Versed 3 mg, Fentanyl 75  mcg.  Additional Medications: Toradol 30 mg. Dilaudid 2 mg. nitroglycerin 200 mcg. Benadryl 50 mg.  Allergies: Sulfa  ANESTHESIA/SEDATION: Moderate sedation time: 77 minutes  CONTRAST:  112 cc Omnipaque 300  PROCEDURE: Vessels selected:  Bilateral third order iliac artery.  The procedure, risks, benefits, and alternatives were explained to the patient. Questions regarding the procedure were encouraged and answered. The patient understands and consents to the procedure.  The right groin was prepped with Betadine in a sterile fashion, and a sterile drape was applied covering the operative field. A sterile gown and sterile gloves were used for the procedure.  Under sonographic guidance, a micropuncture needle was inserted into the right common femoral artery and removed over a 018 wire which was up sized to a Bentson. A 5-French sheath was inserted. Cobra II catheter was advanced into the aorta. The contralateral left common iliac artery was selected. The internal iliac artery on the left was selected. 3T Angiography was performed. A micro catheter was advanced over an 018 glide wire into the left uterine artery. Angiography was performed.  Embolization was performed utilizing1 and 1/3 vial 500-700 micron embospheres.  The micro catheter was removed and flushed. A Waltman's loop was created. The ipsilateral right common iliac artery and right internal iliac artery were selected. Angiography was performed. A micro catheter was advanced over a 0018 glide wire into the right uterine artery. Angiography was performed.  Embolization was again performed with1-2/3 vial 500-700 micron embospheres.  The micro catheter and Cobra catheter were removed. Right femoral angiography was performed. A STARCLOSE device was deployed without complication and hemostasis was achieved.  FINDINGS: Imaging demonstrates bilateral pelvic anatomy and bilateral uterine artery angiography. Expected anatomy is identified. No blood supply from the  uterine artery to the vagina is visualized.  Post embolization angiography demonstrates relative stasis of flow and pruning of the vasculature to the uterus.  COMPLICATIONS: None  IMPRESSION: Successful bilateral uterine artery embolization.   Electronically Signed   By: Marybelle Killings M.D.   On: 07/20/2014 13:42   Ir Angiogram Selective Each Additional Vessel  07/20/2014   CLINICAL DATA:  Pelvic cramping.  Uterine fibroids.  EXAM: UTERINE ARTERY EMBOLIZATION  FLUOROSCOPY TIME:  24 minutes and 42 seconds  MEDICATIONS AND MEDICAL HISTORY: Versed 3 mg, Fentanyl 75 mcg.  Additional Medications: Toradol 30 mg. Dilaudid 2 mg. nitroglycerin 200 mcg. Benadryl 50 mg.  Allergies: Sulfa  ANESTHESIA/SEDATION: Moderate sedation time: 77 minutes  CONTRAST:  112 cc Omnipaque 300  PROCEDURE: Vessels selected:  Bilateral third order iliac artery.  The procedure, risks, benefits, and alternatives were explained to the patient. Questions regarding the procedure were encouraged and answered. The patient understands and consents to the procedure.  The right groin was prepped with Betadine in a sterile fashion, and a sterile drape was applied covering the operative field. A sterile gown and sterile gloves were used for the procedure.  Under sonographic guidance, a micropuncture needle was inserted into the right common femoral artery and removed over a 018 wire which was up sized to a Bentson. A 5-French sheath was inserted. Cobra II catheter was advanced into the aorta. The contralateral left common iliac artery was selected. The internal iliac artery on the left was selected. 3T Angiography was performed. A micro catheter was advanced over an 018  glide wire into the left uterine artery. Angiography was performed.  Embolization was performed utilizing1 and 1/3 vial 500-700 micron embospheres.  The micro catheter was removed and flushed. A Waltman's loop was created. The ipsilateral right common iliac artery and right internal iliac  artery were selected. Angiography was performed. A micro catheter was advanced over a 0018 glide wire into the right uterine artery. Angiography was performed.  Embolization was again performed with1-2/3 vial 500-700 micron embospheres.  The micro catheter and Cobra catheter were removed. Right femoral angiography was performed. A STARCLOSE device was deployed without complication and hemostasis was achieved.  FINDINGS: Imaging demonstrates bilateral pelvic anatomy and bilateral uterine artery angiography. Expected anatomy is identified. No blood supply from the uterine artery to the vagina is visualized.  Post embolization angiography demonstrates relative stasis of flow and pruning of the vasculature to the uterus.  COMPLICATIONS: None  IMPRESSION: Successful bilateral uterine artery embolization.   Electronically Signed   By: Marybelle Killings M.D.   On: 07/20/2014 13:42   Ir Angiogram Selective Each Additional Vessel  07/20/2014   CLINICAL DATA:  Pelvic cramping.  Uterine fibroids.  EXAM: UTERINE ARTERY EMBOLIZATION  FLUOROSCOPY TIME:  24 minutes and 42 seconds  MEDICATIONS AND MEDICAL HISTORY: Versed 3 mg, Fentanyl 75 mcg.  Additional Medications: Toradol 30 mg. Dilaudid 2 mg. nitroglycerin 200 mcg. Benadryl 50 mg.  Allergies: Sulfa  ANESTHESIA/SEDATION: Moderate sedation time: 77 minutes  CONTRAST:  112 cc Omnipaque 300  PROCEDURE: Vessels selected:  Bilateral third order iliac artery.  The procedure, risks, benefits, and alternatives were explained to the patient. Questions regarding the procedure were encouraged and answered. The patient understands and consents to the procedure.  The right groin was prepped with Betadine in a sterile fashion, and a sterile drape was applied covering the operative field. A sterile gown and sterile gloves were used for the procedure.  Under sonographic guidance, a micropuncture needle was inserted into the right common femoral artery and removed over a 018 wire which was up  sized to a Bentson. A 5-French sheath was inserted. Cobra II catheter was advanced into the aorta. The contralateral left common iliac artery was selected. The internal iliac artery on the left was selected. 3T Angiography was performed. A micro catheter was advanced over an 018 glide wire into the left uterine artery. Angiography was performed.  Embolization was performed utilizing1 and 1/3 vial 500-700 micron embospheres.  The micro catheter was removed and flushed. A Waltman's loop was created. The ipsilateral right common iliac artery and right internal iliac artery were selected. Angiography was performed. A micro catheter was advanced over a 0018 glide wire into the right uterine artery. Angiography was performed.  Embolization was again performed with1-2/3 vial 500-700 micron embospheres.  The micro catheter and Cobra catheter were removed. Right femoral angiography was performed. A STARCLOSE device was deployed without complication and hemostasis was achieved.  FINDINGS: Imaging demonstrates bilateral pelvic anatomy and bilateral uterine artery angiography. Expected anatomy is identified. No blood supply from the uterine artery to the vagina is visualized.  Post embolization angiography demonstrates relative stasis of flow and pruning of the vasculature to the uterus.  COMPLICATIONS: None  IMPRESSION: Successful bilateral uterine artery embolization.   Electronically Signed   By: Marybelle Killings M.D.   On: 07/20/2014 13:42   Ir 3d Primitivo Gauze Darreld Mclean  07/20/2014   CLINICAL DATA:  Pelvic cramping.  Uterine fibroids.  EXAM: UTERINE ARTERY EMBOLIZATION  FLUOROSCOPY TIME:  24 minutes and 42 seconds  MEDICATIONS AND MEDICAL HISTORY: Versed 3 mg, Fentanyl 75 mcg.  Additional Medications: Toradol 30 mg. Dilaudid 2 mg. nitroglycerin 200 mcg. Benadryl 50 mg.  Allergies: Sulfa  ANESTHESIA/SEDATION: Moderate sedation time: 77 minutes  CONTRAST:  112 cc Omnipaque 300  PROCEDURE: Vessels selected:  Bilateral third order  iliac artery.  The procedure, risks, benefits, and alternatives were explained to the patient. Questions regarding the procedure were encouraged and answered. The patient understands and consents to the procedure.  The right groin was prepped with Betadine in a sterile fashion, and a sterile drape was applied covering the operative field. A sterile gown and sterile gloves were used for the procedure.  Under sonographic guidance, a micropuncture needle was inserted into the right common femoral artery and removed over a 018 wire which was up sized to a Bentson. A 5-French sheath was inserted. Cobra II catheter was advanced into the aorta. The contralateral left common iliac artery was selected. The internal iliac artery on the left was selected. 3T Angiography was performed. A micro catheter was advanced over an 018 glide wire into the left uterine artery. Angiography was performed.  Embolization was performed utilizing1 and 1/3 vial 500-700 micron embospheres.  The micro catheter was removed and flushed. A Waltman's loop was created. The ipsilateral right common iliac artery and right internal iliac artery were selected. Angiography was performed. A micro catheter was advanced over a 0018 glide wire into the right uterine artery. Angiography was performed.  Embolization was again performed with1-2/3 vial 500-700 micron embospheres.  The micro catheter and Cobra catheter were removed. Right femoral angiography was performed. A STARCLOSE device was deployed without complication and hemostasis was achieved.  FINDINGS: Imaging demonstrates bilateral pelvic anatomy and bilateral uterine artery angiography. Expected anatomy is identified. No blood supply from the uterine artery to the vagina is visualized.  Post embolization angiography demonstrates relative stasis of flow and pruning of the vasculature to the uterus.  COMPLICATIONS: None  IMPRESSION: Successful bilateral uterine artery embolization.   Electronically  Signed   By: Marybelle Killings M.D.   On: 07/20/2014 13:42   Ir US Guide Vasc Access Right  07/20/2014   CLINICAL DATA:  Pelvic cramping.  Uterine fibroids.  EXAM: UTERINE ARTERY EMBOLIZATION  FLUOROSCOPY TIME:  24 minutes and 42 seconds  MEDICATIONS AND MEDICAL HISTORY: Versed 3 mg, Fentanyl 75 mcg.  Additional Medications: Toradol 30 mg. Dilaudid 2 mg. nitroglycerin 200 mcg. Benadryl 50 mg.  Allergies: Sulfa  ANESTHESIA/SEDATION: Moderate sedation time: 77 minutes  CONTRAST:  112 cc Omnipaque 300  PROCEDURE: Vessels selected:  Bilateral third order iliac artery.  The procedure, risks, benefits, and alternatives were explained to the patient. Questions regarding the procedure were encouraged and answered. The patient understands and consents to the procedure.  The right groin was prepped with Betadine in a sterile fashion, and a sterile drape was applied covering the operative field. A sterile gown and sterile gloves were used for the procedure.  Under sonographic guidance, a micropuncture needle was inserted into the right common femoral artery and removed over a 018 wire which was up sized to a Bentson. A 5-French sheath was inserted. Cobra II catheter was advanced into the aorta. The contralateral left common iliac artery was selected. The internal iliac artery on the left was selected. 3T Angiography was performed. A micro catheter was advanced over an 018 glide wire into the left uterine artery. Angiography was performed.  Embolization was performed utilizing1 and 1/3 vial 500-700 micron embospheres.  The  micro catheter was removed and flushed. A Waltman's loop was created. The ipsilateral right common iliac artery and right internal iliac artery were selected. Angiography was performed. A micro catheter was advanced over a 0018 glide wire into the right uterine artery. Angiography was performed.  Embolization was again performed with1-2/3 vial 500-700 micron embospheres.  The micro catheter and Cobra catheter  were removed. Right femoral angiography was performed. A STARCLOSE device was deployed without complication and hemostasis was achieved.  FINDINGS: Imaging demonstrates bilateral pelvic anatomy and bilateral uterine artery angiography. Expected anatomy is identified. No blood supply from the uterine artery to the vagina is visualized.  Post embolization angiography demonstrates relative stasis of flow and pruning of the vasculature to the uterus.  COMPLICATIONS: None  IMPRESSION: Successful bilateral uterine artery embolization.   Electronically Signed   By: Marybelle Killings M.D.   On: 07/20/2014 13:42   Ir Embo Tumor Organ Ischemia Infarct Inc Guide Roadmapping  07/20/2014   CLINICAL DATA:  Pelvic cramping.  Uterine fibroids.  EXAM: UTERINE ARTERY EMBOLIZATION  FLUOROSCOPY TIME:  24 minutes and 42 seconds  MEDICATIONS AND MEDICAL HISTORY: Versed 3 mg, Fentanyl 75 mcg.  Additional Medications: Toradol 30 mg. Dilaudid 2 mg. nitroglycerin 200 mcg. Benadryl 50 mg.  Allergies: Sulfa  ANESTHESIA/SEDATION: Moderate sedation time: 77 minutes  CONTRAST:  112 cc Omnipaque 300  PROCEDURE: Vessels selected:  Bilateral third order iliac artery.  The procedure, risks, benefits, and alternatives were explained to the patient. Questions regarding the procedure were encouraged and answered. The patient understands and consents to the procedure.  The right groin was prepped with Betadine in a sterile fashion, and a sterile drape was applied covering the operative field. A sterile gown and sterile gloves were used for the procedure.  Under sonographic guidance, a micropuncture needle was inserted into the right common femoral artery and removed over a 018 wire which was up sized to a Bentson. A 5-French sheath was inserted. Cobra II catheter was advanced into the aorta. The contralateral left common iliac artery was selected. The internal iliac artery on the left was selected. 3T Angiography was performed. A micro catheter was  advanced over an 018 glide wire into the left uterine artery. Angiography was performed.  Embolization was performed utilizing1 and 1/3 vial 500-700 micron embospheres.  The micro catheter was removed and flushed. A Waltman's loop was created. The ipsilateral right common iliac artery and right internal iliac artery were selected. Angiography was performed. A micro catheter was advanced over a 0018 glide wire into the right uterine artery. Angiography was performed.  Embolization was again performed with1-2/3 vial 500-700 micron embospheres.  The micro catheter and Cobra catheter were removed. Right femoral angiography was performed. A STARCLOSE device was deployed without complication and hemostasis was achieved.  FINDINGS: Imaging demonstrates bilateral pelvic anatomy and bilateral uterine artery angiography. Expected anatomy is identified. No blood supply from the uterine artery to the vagina is visualized.  Post embolization angiography demonstrates relative stasis of flow and pruning of the vasculature to the uterus.  COMPLICATIONS: None  IMPRESSION: Successful bilateral uterine artery embolization.   Electronically Signed   By: Marybelle Killings M.D.   On: 07/20/2014 13:42    Labs:  CBC:  Recent Labs  07/20/14 0820  WBC 8.9  HGB 14.5  HCT 43.4  PLT 275    COAGS:  Recent Labs  07/20/14 0820  INR 0.95    BMP:  Recent Labs  07/20/14 0820  NA 136  K  4.0  CL 103  CO2 25  GLUCOSE 98  BUN 13  CALCIUM 9.2  CREATININE 0.77  GFRNONAA >60  GFRAA >60   Assessment and Plan: Symptomatic uterine fibroids S/p successful UFE today, access site soft no hematoma Admit for observation and pain control Continue Morphine PCA, IV hydration Encourage activity as tolerated Advance diet as tolerated Possible D/C in am if stable   SignedHedy Jacob 07/20/2014, 4:22 PM

## 2014-07-20 NOTE — Sedation Documentation (Signed)
5Fr. sheath removed from right femoral artery by Dr. Barbie Banner. Hemostasis achieved using exoseal closure device and manual pressure held for 2 minutes by Lambert Mody, RTR. RDP +2, groin level 0.

## 2014-07-21 ENCOUNTER — Other Ambulatory Visit: Payer: Self-pay | Admitting: Radiology

## 2014-07-21 DIAGNOSIS — D259 Leiomyoma of uterus, unspecified: Secondary | ICD-10-CM | POA: Diagnosis not present

## 2014-07-21 MED ORDER — HYDROCODONE-ACETAMINOPHEN 5-325 MG PO TABS
1.0000 | ORAL_TABLET | ORAL | Status: DC | PRN
  Administered 2014-07-21: 1 via ORAL
  Filled 2014-07-21: qty 1

## 2014-07-21 MED ORDER — KETOROLAC TROMETHAMINE 15 MG/ML IJ SOLN
INTRAMUSCULAR | Status: AC
  Filled 2014-07-21: qty 1

## 2014-07-21 MED ORDER — IBUPROFEN 600 MG PO TABS: 600.0000 mg | ORAL_TABLET | Freq: Four times a day (QID) | ORAL | Status: DC | PRN

## 2014-07-21 MED ORDER — HYDROCODONE-ACETAMINOPHEN 5-325 MG PO TABS: 1.0000 | ORAL_TABLET | Freq: Four times a day (QID) | ORAL | Status: DC | PRN

## 2014-07-21 MED ORDER — ONDANSETRON HCL 4 MG PO TABS: 4.0000 mg | ORAL_TABLET | Freq: Three times a day (TID) | ORAL | Status: DC | PRN

## 2014-07-21 MED ORDER — IBUPROFEN 200 MG PO TABS
600.0000 mg | ORAL_TABLET | ORAL | Status: DC | PRN
  Administered 2014-07-21: 600 mg via ORAL
  Filled 2014-07-21: qty 3

## 2014-07-21 MED ORDER — KETOROLAC TROMETHAMINE 15 MG/ML IJ SOLN
15.0000 mg | Freq: Once | INTRAMUSCULAR | Status: AC
  Administered 2014-07-21: 15 mg via INTRAVENOUS

## 2014-07-21 MED ORDER — DOCUSATE SODIUM 100 MG PO CAPS: 100.0000 mg | ORAL_CAPSULE | Freq: Two times a day (BID) | ORAL | Status: DC

## 2014-07-21 NOTE — Progress Notes (Signed)
Patient vomited once overnight and received phenergan.  No more complaints of n/v since this was given at 2227 pm.  Will advance diet this am.  Dressing continues to appear C,D,&I.  Vitals stable except for heart rate elevated this am.  Heart rate is staying about 110 (range is from 102-130).  Dr. Earleen Newport on call was notified and an order for Toradol 15 mg was received and administered.  Will continue to monitor patient.Azzie Glatter [place]

## 2014-07-21 NOTE — Discharge Summary (Signed)
Patient ID: Ariana Edwards MRN: [id number] DOB/AGE: 03/27/1970 44 y.o.  Admit date: 07/20/2014 Discharge date: 07/21/2014  Admission Diagnoses: Symptomatic fibroids.  Discharge Diagnoses:  Active Problems:   Fibroids S/p successful bilateral uterine artery embolization   Discharged Condition: Good, stable.   Hospital Course: [facility] [name] is a 44 y.o. female with symptomatic uterine fibroids with c/o bloating, cramping and urinary incontinence. She denies any menorrhagia. She has been seen in consult on 05/04/14 with a MRI on 05/16/14 and was deemed a candidate for Uterine fibroid embolization. She was scheduled for an elective procedure on 07/20/14 and underwent the procedure without complications. She was admitted overnight for pain control and observation.   Her vitals are stable today. She has had her foley catheter removed and urinated on her own without difficulty. She denies any dysuria, hesitancy or frequency. She denies any right groin access site pain, redness, swelling or bleeding. She complains of 5/10 lower pelvic pain that is controlled with oral pain medications. She has tolerated breakfast, lunch and oral medications today without nausea or vomiting. She does complain of minimal spotting today. She denies any chest pain or shortness of breath. She has ambulated without difficulty. She states she is ready for discharge home today and has been seen by Dr. Anselm Pancoast and is deemed stable for discharge. She was given the below instructions and medications and states understanding. She will follow up in IR clinic in 2 weeks prior to her trip planned on July 5th.   Treatments: Successful bilateral uterine artery embolization.   Discharge Exam: Blood pressure 114/71, pulse 98, temperature 98.4 F (36.9 C), temperature source Oral, resp. rate 20, height 5\' 2"  (1.575 m), weight 120 lb (54.432 kg), last menstrual period 04/19/2014, SpO2 97 %.  Physical Exam: General:  A&Ox3, NAD, sitting up in bed Heart: RRR without M/G/R Lungs: CTA b/l Abd: Soft, lower pelvic TTP, (+) BS Ext: RCFA access site dressing C/D/I, soft, NT, no signs of bleeding or hematoma, DP 1+ b/l  Disposition: Home.       Discharge Instructions    Call MD for:  difficulty breathing, headache or visual disturbances    Complete by:  As directed      Call MD for:  extreme fatigue    Complete by:  As directed      Call MD for:  hives    Complete by:  As directed      Call MD for:  persistant dizziness or light-headedness    Complete by:  As directed      Call MD for:  persistant nausea and vomiting    Complete by:  As directed      Call MD for:  redness, tenderness, or signs of infection (pain, swelling, redness, odor or green/yellow discharge around incision site)    Complete by:  As directed      Call MD for:  severe uncontrolled pain    Complete by:  As directed      Call MD for:  temperature >100.4    Complete by:  As directed      Diet - low sodium heart healthy    Complete by:  As directed      Driving Restrictions    Complete by:  As directed   No driving x 3 days     Increase activity slowly    Complete by:  As directed      Lifting restrictions    Complete by:  As  directed   No lifting over 20 lbs x 5 days     Remove dressing in 24 hours    Complete by:  As directed             Medication List    STOP taking these medications Patient states she is not taking these medications       ciprofloxacin 250 MG tablet  Commonly known as:  CIPRO     ipratropium 0.03 % nasal spray  Commonly known as:  ATROVENT     methylPREDNIsolone 4 MG tablet  Commonly known as:  MEDROL DOSPACK      TAKE these medications        docusate sodium 100 MG capsule  Commonly known as:  COLACE  Take 1 capsule (100 mg total) by mouth 2 (two) times daily.     HYDROcodone-acetaminophen 5-325 MG per tablet  Commonly known as:  NORCO/VICODIN  Take 1-2 tablets by mouth every 6 (six)  hours as needed for moderate pain.     ibuprofen 600 MG tablet  Commonly known as:  ADVIL,MOTRIN  Take 1 tablet (600 mg total) by mouth every 6 (six) hours as needed for cramping.     ondansetron 4 MG tablet  Commonly known as:  ZOFRAN  Take 1 tablet (4 mg total) by mouth every 8 (eight) hours as needed for nausea or vomiting.       Follow-up Information    Follow up with HOSS, ART A, MD In 2 weeks.   Specialty:  Interventional Radiology   Why:  Our office will call with appointment [phone]   Contact information:   [address] [place] [address] [phone]        Signed: Hedy Jacob 07/21/2014, 3:09 PM   I have spent Greater Than 30 Minutes discharging [name]

## 2014-07-21 NOTE — Discharge Instructions (Signed)
Uterine Artery Embolization for Fibroids Uterine artery embolization is a nonsurgical treatment to shrink fibroids. A thin plastic tube (catheter) is used to inject material that blocks off the blood supply to the fibroid, which causes the fibroid to shrink. LET [facility] CARE PROVIDER KNOW ABOUT:  Any allergies you have.  All medicines you are taking, including vitamins, herbs, eye drops, creams, and over-the-counter medicines.  Previous problems you or members of your family have had with the use of anesthetics.  Any blood disorders you have.  Previous surgeries you have had.  Medical conditions you have. RISKS AND COMPLICATIONS  Injury to the uterus from decreased blood supply  Infection.  Blood infection (septicemia).  Lack of menstrual periods (amenorrhea).  Death of tissue cells (necrosis) around your bladder or vulva.  Development of a hole between organs or from an organ to the surface of your skin (fistula).  Blood clot in the legs (deep vein thrombosis) or lung (pulmonary embolus). BEFORE THE PROCEDURE  Ask your health care provider about changing or stopping your regular medicines.   Do not take aspirin or blood thinners (anticoagulants) for 1 week before the surgery or as directed by your health care provider.  Do not eat or drink anything for 8 hours before the surgery or as directed by your health care provider.   Empty your bladder before the procedure begins. PROCEDURE   An IV tube will be placed into one of your veins. This will be used to give you a sedative and pain medication (conscious sedation).  You will be given a medicine that numbs the area (local anesthetic).  A small cut will be made in your groin. A catheter is then inserted into the main artery of your leg.  The catheter will be guided through the artery to your uterus. A series of images will be taken while dye is injected through the catheter in your groin. X-rays are taken at the  same time. This is done to provide a road map of the blood supply to your uterus and fibroids.  Tiny plastic spheres, about the size of sand grains, will be injected through the catheter. Metal coils may be used to help block the artery. The particles will lodge in tiny branches of the uterine artery that supplies blood to the fibroids.  The procedure is repeated on the artery that supplies the other side of the uterus.  The catheter is then removed and pressure is held to stop any bleeding. No stitches are needed.  A dressing is then placed over the cut (incision). AFTER THE PROCEDURE  You will be taken to a recovery area where your progress will be monitored until you are awake, stable, and taking fluids well. If there are no other problems, you will then be moved to a regular hospital room.  You will be observed overnight in the hospital.  You will have cramping that should be controlled with pain medication. Document Released: 04/08/2005 Document Revised: 11/11/2012 Document Reviewed: 08/06/2012 [facility] Patient Information 2015 [place], [place]. This information is not intended to replace advice given to you by your health care provider. Make sure you discuss any questions you have with your health care provider.

## 2014-07-22 ENCOUNTER — Other Ambulatory Visit (HOSPITAL_COMMUNITY): Payer: Self-pay | Admitting: Interventional Radiology

## 2014-07-22 DIAGNOSIS — D259 Leiomyoma of uterus, unspecified: Secondary | ICD-10-CM

## 2014-07-27 ENCOUNTER — Ambulatory Visit
Admission: RE | Admit: 2014-07-27 | Discharge: 2014-07-27 | Disposition: A | Discharge status: Home / Self Care | Payer: BC Managed Care – PPO | Source: Ambulatory Visit | Attending: Interventional Radiology | Admitting: Interventional Radiology

## 2014-07-27 ENCOUNTER — Other Ambulatory Visit: Payer: BC Managed Care – PPO

## 2014-07-27 DIAGNOSIS — D259 Leiomyoma of uterus, unspecified: Secondary | ICD-10-CM

## 2014-07-27 NOTE — Progress Notes (Signed)
Patient ID: Ariana Edwards, female   DOB: 08/30/1970, 44 y.o.   MRN: [id number]    Chief Complaint: Chief Complaint  Patient presents with  . Follow-up    1 week follow up [place]    Referring Physician(s): Ariana Edwards  History of Present Illness: Ariana Edwards is a 44 y.o. female who is 1 week post uterine artery embolization. Presently, she is doing fine and tolerating by mouth intake. She did have pain after the procedure which subsided. She denies any arterial complications such as right groin hematoma. Her pelvic cramping is also minimal at this point.  Past Medical History  Diagnosis Date  . Uterine fibroid   . Rosacea   . Hyperlipidemia     Past Surgical History  Procedure Laterality Date  . Breast surgery    . Fracture surgery      Allergies: Sulfa antibiotics  Medications: Prior to Admission medications   Medication Sig Start Date End Date Taking? Authorizing Provider  docusate sodium (COLACE) 100 MG capsule Take 1 capsule (100 mg total) by mouth 2 (two) times daily. 07/21/14   Ariana Jacob, PA-C  HYDROcodone-acetaminophen (NORCO/VICODIN) 5-325 MG per tablet Take 1-2 tablets by mouth every 6 (six) hours as needed for moderate pain. 07/21/14   Ariana Jacob, PA-C  ibuprofen (ADVIL,MOTRIN) 600 MG tablet Take 1 tablet (600 mg total) by mouth every 6 (six) hours as needed for cramping. 07/21/14   Ariana Jacob, PA-C  ondansetron (ZOFRAN) 4 MG tablet Take 1 tablet (4 mg total) by mouth every 8 (eight) hours as needed for nausea or vomiting. 07/21/14   Ariana Jacob, PA-C     Family History  Problem Relation Age of Onset  . Hypertension Mother   . Hypothyroidism Sister     History   Social History  . Marital Status: Single    Spouse Name: N/A  . Number of Children: N/A  . Years of Education: N/A   Social History Main Topics  . Smoking status: Never Smoker   . Smokeless tobacco: Not on file  . Alcohol Use: Not on file  . Drug Use: Not  on file  . Sexual Activity: Not on file   Other Topics Concern  . Not on file   Social History Narrative     Review of Systems: A 12 point ROS discussed and pertinent positives are indicated in the HPI above.  All other systems are negative.  Review of Systems  Vital Signs: BP 114/69 mmHg  Pulse 98  Temp(Src) 98.3 F (36.8 C) (Oral)  Resp 15  SpO2 98%  LMP 04/19/2014 (Approximate)  Physical Exam  Constitutional: She is oriented to person, place, and time. She appears well-developed and well-nourished.  HENT:  Head: Normocephalic and atraumatic.  Neck: Normal range of motion. Neck supple.  Cardiovascular: Normal rate, regular rhythm and normal heart sounds.   Pulmonary/Chest: Effort normal and breath sounds normal.  Musculoskeletal:  Examination of the right groin demonstrates no hematoma or pseudoaneurysm. Pulses are intact distally.  Neurological: She is alert and oriented to person, place, and time.  Skin: Skin is warm and dry.    Mallampati Score:     Imaging: Ir Angiogram Pelvis Selective Or Supraselective  07/20/2014   CLINICAL DATA:  Pelvic cramping.  Uterine fibroids.  EXAM: UTERINE ARTERY EMBOLIZATION  FLUOROSCOPY TIME:  24 minutes and 42 seconds  MEDICATIONS AND MEDICAL HISTORY: Versed 3 mg, Fentanyl 75 mcg.  Additional Medications: Toradol 30 mg. Dilaudid 2  mg. nitroglycerin 200 mcg. Benadryl 50 mg.  Allergies: Sulfa  ANESTHESIA/SEDATION: Moderate sedation time: 77 minutes  CONTRAST:  112 cc Omnipaque 300  PROCEDURE: Vessels selected:  Bilateral third order iliac artery.  The procedure, risks, benefits, and alternatives were explained to the patient. Questions regarding the procedure were encouraged and answered. The patient understands and consents to the procedure.  The right groin was prepped with Betadine in a sterile fashion, and a sterile drape was applied covering the operative field. A sterile gown and sterile gloves were used for the procedure.  Under  sonographic guidance, a micropuncture needle was inserted into the right common femoral artery and removed over a 018 wire which was up sized to a Bentson. A 5-French sheath was inserted. Cobra II catheter was advanced into the aorta. The contralateral left common iliac artery was selected. The internal iliac artery on the left was selected. 3T Angiography was performed. A micro catheter was advanced over an 018 glide wire into the left uterine artery. Angiography was performed.  Embolization was performed utilizing1 and 1/3 vial 500-700 micron embospheres.  The micro catheter was removed and flushed. A Waltman's loop was created. The ipsilateral right common iliac artery and right internal iliac artery were selected. Angiography was performed. A micro catheter was advanced over a 0018 glide wire into the right uterine artery. Angiography was performed.  Embolization was again performed with1-2/3 vial 500-700 micron embospheres.  The micro catheter and Cobra catheter were removed. Right femoral angiography was performed. A STARCLOSE device was deployed without complication and hemostasis was achieved.  FINDINGS: Imaging demonstrates bilateral pelvic anatomy and bilateral uterine artery angiography. Expected anatomy is identified. No blood supply from the uterine artery to the vagina is visualized.  Post embolization angiography demonstrates relative stasis of flow and pruning of the vasculature to the uterus.  COMPLICATIONS: None  IMPRESSION: Successful bilateral uterine artery embolization.   Electronically Signed   By: Marybelle Killings M.D.   On: 07/20/2014 13:42   Ir Angiogram Pelvis Selective Or Supraselective  07/20/2014   CLINICAL DATA:  Pelvic cramping.  Uterine fibroids.  EXAM: UTERINE ARTERY EMBOLIZATION  FLUOROSCOPY TIME:  24 minutes and 42 seconds  MEDICATIONS AND MEDICAL HISTORY: Versed 3 mg, Fentanyl 75 mcg.  Additional Medications: Toradol 30 mg. Dilaudid 2 mg. nitroglycerin 200 mcg. Benadryl 50 mg.   Allergies: Sulfa  ANESTHESIA/SEDATION: Moderate sedation time: 77 minutes  CONTRAST:  112 cc Omnipaque 300  PROCEDURE: Vessels selected:  Bilateral third order iliac artery.  The procedure, risks, benefits, and alternatives were explained to the patient. Questions regarding the procedure were encouraged and answered. The patient understands and consents to the procedure.  The right groin was prepped with Betadine in a sterile fashion, and a sterile drape was applied covering the operative field. A sterile gown and sterile gloves were used for the procedure.  Under sonographic guidance, a micropuncture needle was inserted into the right common femoral artery and removed over a 018 wire which was up sized to a Bentson. A 5-French sheath was inserted. Cobra II catheter was advanced into the aorta. The contralateral left common iliac artery was selected. The internal iliac artery on the left was selected. 3T Angiography was performed. A micro catheter was advanced over an 018 glide wire into the left uterine artery. Angiography was performed.  Embolization was performed utilizing1 and 1/3 vial 500-700 micron embospheres.  The micro catheter was removed and flushed. A Waltman's loop was created. The ipsilateral right common iliac artery and  right internal iliac artery were selected. Angiography was performed. A micro catheter was advanced over a 0018 glide wire into the right uterine artery. Angiography was performed.  Embolization was again performed with1-2/3 vial 500-700 micron embospheres.  The micro catheter and Cobra catheter were removed. Right femoral angiography was performed. A STARCLOSE device was deployed without complication and hemostasis was achieved.  FINDINGS: Imaging demonstrates bilateral pelvic anatomy and bilateral uterine artery angiography. Expected anatomy is identified. No blood supply from the uterine artery to the vagina is visualized.  Post embolization angiography demonstrates relative stasis  of flow and pruning of the vasculature to the uterus.  COMPLICATIONS: None  IMPRESSION: Successful bilateral uterine artery embolization.   Electronically Signed   By: Marybelle Killings M.D.   On: 07/20/2014 13:42   Ir Angiogram Selective Each Additional Vessel  07/20/2014   CLINICAL DATA:  Pelvic cramping.  Uterine fibroids.  EXAM: UTERINE ARTERY EMBOLIZATION  FLUOROSCOPY TIME:  24 minutes and 42 seconds  MEDICATIONS AND MEDICAL HISTORY: Versed 3 mg, Fentanyl 75 mcg.  Additional Medications: Toradol 30 mg. Dilaudid 2 mg. nitroglycerin 200 mcg. Benadryl 50 mg.  Allergies: Sulfa  ANESTHESIA/SEDATION: Moderate sedation time: 77 minutes  CONTRAST:  112 cc Omnipaque 300  PROCEDURE: Vessels selected:  Bilateral third order iliac artery.  The procedure, risks, benefits, and alternatives were explained to the patient. Questions regarding the procedure were encouraged and answered. The patient understands and consents to the procedure.  The right groin was prepped with Betadine in a sterile fashion, and a sterile drape was applied covering the operative field. A sterile gown and sterile gloves were used for the procedure.  Under sonographic guidance, a micropuncture needle was inserted into the right common femoral artery and removed over a 018 wire which was up sized to a Bentson. A 5-French sheath was inserted. Cobra II catheter was advanced into the aorta. The contralateral left common iliac artery was selected. The internal iliac artery on the left was selected. 3T Angiography was performed. A micro catheter was advanced over an 018 glide wire into the left uterine artery. Angiography was performed.  Embolization was performed utilizing1 and 1/3 vial 500-700 micron embospheres.  The micro catheter was removed and flushed. A Waltman's loop was created. The ipsilateral right common iliac artery and right internal iliac artery were selected. Angiography was performed. A micro catheter was advanced over a 0018 glide wire into  the right uterine artery. Angiography was performed.  Embolization was again performed with1-2/3 vial 500-700 micron embospheres.  The micro catheter and Cobra catheter were removed. Right femoral angiography was performed. A STARCLOSE device was deployed without complication and hemostasis was achieved.  FINDINGS: Imaging demonstrates bilateral pelvic anatomy and bilateral uterine artery angiography. Expected anatomy is identified. No blood supply from the uterine artery to the vagina is visualized.  Post embolization angiography demonstrates relative stasis of flow and pruning of the vasculature to the uterus.  COMPLICATIONS: None  IMPRESSION: Successful bilateral uterine artery embolization.   Electronically Signed   By: Marybelle Killings M.D.   On: 07/20/2014 13:42   Ir Angiogram Selective Each Additional Vessel  07/20/2014   CLINICAL DATA:  Pelvic cramping.  Uterine fibroids.  EXAM: UTERINE ARTERY EMBOLIZATION  FLUOROSCOPY TIME:  24 minutes and 42 seconds  MEDICATIONS AND MEDICAL HISTORY: Versed 3 mg, Fentanyl 75 mcg.  Additional Medications: Toradol 30 mg. Dilaudid 2 mg. nitroglycerin 200 mcg. Benadryl 50 mg.  Allergies: Sulfa  ANESTHESIA/SEDATION: Moderate sedation time: 77 minutes  CONTRAST:  112 cc Omnipaque 300  PROCEDURE: Vessels selected:  Bilateral third order iliac artery.  The procedure, risks, benefits, and alternatives were explained to the patient. Questions regarding the procedure were encouraged and answered. The patient understands and consents to the procedure.  The right groin was prepped with Betadine in a sterile fashion, and a sterile drape was applied covering the operative field. A sterile gown and sterile gloves were used for the procedure.  Under sonographic guidance, a micropuncture needle was inserted into the right common femoral artery and removed over a 018 wire which was up sized to a Bentson. A 5-French sheath was inserted. Cobra II catheter was advanced into the aorta. The  contralateral left common iliac artery was selected. The internal iliac artery on the left was selected. 3T Angiography was performed. A micro catheter was advanced over an 018 glide wire into the left uterine artery. Angiography was performed.  Embolization was performed utilizing1 and 1/3 vial 500-700 micron embospheres.  The micro catheter was removed and flushed. A Waltman's loop was created. The ipsilateral right common iliac artery and right internal iliac artery were selected. Angiography was performed. A micro catheter was advanced over a 0018 glide wire into the right uterine artery. Angiography was performed.  Embolization was again performed with1-2/3 vial 500-700 micron embospheres.  The micro catheter and Cobra catheter were removed. Right femoral angiography was performed. A STARCLOSE device was deployed without complication and hemostasis was achieved.  FINDINGS: Imaging demonstrates bilateral pelvic anatomy and bilateral uterine artery angiography. Expected anatomy is identified. No blood supply from the uterine artery to the vagina is visualized.  Post embolization angiography demonstrates relative stasis of flow and pruning of the vasculature to the uterus.  COMPLICATIONS: None  IMPRESSION: Successful bilateral uterine artery embolization.   Electronically Signed   By: Marybelle Killings M.D.   On: 07/20/2014 13:42   Ir 3d Primitivo Gauze Darreld Mclean  07/20/2014   CLINICAL DATA:  Pelvic cramping.  Uterine fibroids.  EXAM: UTERINE ARTERY EMBOLIZATION  FLUOROSCOPY TIME:  24 minutes and 42 seconds  MEDICATIONS AND MEDICAL HISTORY: Versed 3 mg, Fentanyl 75 mcg.  Additional Medications: Toradol 30 mg. Dilaudid 2 mg. nitroglycerin 200 mcg. Benadryl 50 mg.  Allergies: Sulfa  ANESTHESIA/SEDATION: Moderate sedation time: 77 minutes  CONTRAST:  112 cc Omnipaque 300  PROCEDURE: Vessels selected:  Bilateral third order iliac artery.  The procedure, risks, benefits, and alternatives were explained to the patient. Questions  regarding the procedure were encouraged and answered. The patient understands and consents to the procedure.  The right groin was prepped with Betadine in a sterile fashion, and a sterile drape was applied covering the operative field. A sterile gown and sterile gloves were used for the procedure.  Under sonographic guidance, a micropuncture needle was inserted into the right common femoral artery and removed over a 018 wire which was up sized to a Bentson. A 5-French sheath was inserted. Cobra II catheter was advanced into the aorta. The contralateral left common iliac artery was selected. The internal iliac artery on the left was selected. 3T Angiography was performed. A micro catheter was advanced over an 018 glide wire into the left uterine artery. Angiography was performed.  Embolization was performed utilizing1 and 1/3 vial 500-700 micron embospheres.  The micro catheter was removed and flushed. A Waltman's loop was created. The ipsilateral right common iliac artery and right internal iliac artery were selected. Angiography was performed. A micro catheter was advanced over a 0018 glide wire into the right  uterine artery. Angiography was performed.  Embolization was again performed with1-2/3 vial 500-700 micron embospheres.  The micro catheter and Cobra catheter were removed. Right femoral angiography was performed. A STARCLOSE device was deployed without complication and hemostasis was achieved.  FINDINGS: Imaging demonstrates bilateral pelvic anatomy and bilateral uterine artery angiography. Expected anatomy is identified. No blood supply from the uterine artery to the vagina is visualized.  Post embolization angiography demonstrates relative stasis of flow and pruning of the vasculature to the uterus.  COMPLICATIONS: None  IMPRESSION: Successful bilateral uterine artery embolization.   Electronically Signed   By: Marybelle Killings M.D.   On: 07/20/2014 13:42   Ir US Guide Vasc Access Right  07/20/2014    CLINICAL DATA:  Pelvic cramping.  Uterine fibroids.  EXAM: UTERINE ARTERY EMBOLIZATION  FLUOROSCOPY TIME:  24 minutes and 42 seconds  MEDICATIONS AND MEDICAL HISTORY: Versed 3 mg, Fentanyl 75 mcg.  Additional Medications: Toradol 30 mg. Dilaudid 2 mg. nitroglycerin 200 mcg. Benadryl 50 mg.  Allergies: Sulfa  ANESTHESIA/SEDATION: Moderate sedation time: 77 minutes  CONTRAST:  112 cc Omnipaque 300  PROCEDURE: Vessels selected:  Bilateral third order iliac artery.  The procedure, risks, benefits, and alternatives were explained to the patient. Questions regarding the procedure were encouraged and answered. The patient understands and consents to the procedure.  The right groin was prepped with Betadine in a sterile fashion, and a sterile drape was applied covering the operative field. A sterile gown and sterile gloves were used for the procedure.  Under sonographic guidance, a micropuncture needle was inserted into the right common femoral artery and removed over a 018 wire which was up sized to a Bentson. A 5-French sheath was inserted. Cobra II catheter was advanced into the aorta. The contralateral left common iliac artery was selected. The internal iliac artery on the left was selected. 3T Angiography was performed. A micro catheter was advanced over an 018 glide wire into the left uterine artery. Angiography was performed.  Embolization was performed utilizing1 and 1/3 vial 500-700 micron embospheres.  The micro catheter was removed and flushed. A Waltman's loop was created. The ipsilateral right common iliac artery and right internal iliac artery were selected. Angiography was performed. A micro catheter was advanced over a 0018 glide wire into the right uterine artery. Angiography was performed.  Embolization was again performed with1-2/3 vial 500-700 micron embospheres.  The micro catheter and Cobra catheter were removed. Right femoral angiography was performed. A STARCLOSE device was deployed without  complication and hemostasis was achieved.  FINDINGS: Imaging demonstrates bilateral pelvic anatomy and bilateral uterine artery angiography. Expected anatomy is identified. No blood supply from the uterine artery to the vagina is visualized.  Post embolization angiography demonstrates relative stasis of flow and pruning of the vasculature to the uterus.  COMPLICATIONS: None  IMPRESSION: Successful bilateral uterine artery embolization.   Electronically Signed   By: Marybelle Killings M.D.   On: 07/20/2014 13:42   Ir Embo Tumor Organ Ischemia Infarct Inc Guide Roadmapping  07/20/2014   CLINICAL DATA:  Pelvic cramping.  Uterine fibroids.  EXAM: UTERINE ARTERY EMBOLIZATION  FLUOROSCOPY TIME:  24 minutes and 42 seconds  MEDICATIONS AND MEDICAL HISTORY: Versed 3 mg, Fentanyl 75 mcg.  Additional Medications: Toradol 30 mg. Dilaudid 2 mg. nitroglycerin 200 mcg. Benadryl 50 mg.  Allergies: Sulfa  ANESTHESIA/SEDATION: Moderate sedation time: 77 minutes  CONTRAST:  112 cc Omnipaque 300  PROCEDURE: Vessels selected:  Bilateral third order iliac artery.  The procedure, risks, benefits,  and alternatives were explained to the patient. Questions regarding the procedure were encouraged and answered. The patient understands and consents to the procedure.  The right groin was prepped with Betadine in a sterile fashion, and a sterile drape was applied covering the operative field. A sterile gown and sterile gloves were used for the procedure.  Under sonographic guidance, a micropuncture needle was inserted into the right common femoral artery and removed over a 018 wire which was up sized to a Bentson. A 5-French sheath was inserted. Cobra II catheter was advanced into the aorta. The contralateral left common iliac artery was selected. The internal iliac artery on the left was selected. 3T Angiography was performed. A micro catheter was advanced over an 018 glide wire into the left uterine artery. Angiography was performed.  Embolization  was performed utilizing1 and 1/3 vial 500-700 micron embospheres.  The micro catheter was removed and flushed. A Waltman's loop was created. The ipsilateral right common iliac artery and right internal iliac artery were selected. Angiography was performed. A micro catheter was advanced over a 0018 glide wire into the right uterine artery. Angiography was performed.  Embolization was again performed with1-2/3 vial 500-700 micron embospheres.  The micro catheter and Cobra catheter were removed. Right femoral angiography was performed. A STARCLOSE device was deployed without complication and hemostasis was achieved.  FINDINGS: Imaging demonstrates bilateral pelvic anatomy and bilateral uterine artery angiography. Expected anatomy is identified. No blood supply from the uterine artery to the vagina is visualized.  Post embolization angiography demonstrates relative stasis of flow and pruning of the vasculature to the uterus.  COMPLICATIONS: None  IMPRESSION: Successful bilateral uterine artery embolization.   Electronically Signed   By: Marybelle Killings M.D.   On: 07/20/2014 13:42    Labs:  CBC:  Recent Labs  07/20/14 0820  WBC 8.9  HGB 14.5  HCT 43.4  PLT 275    COAGS:  Recent Labs  07/20/14 0820  INR 0.95    BMP:  Recent Labs  07/20/14 0820  NA 136  K 4.0  CL 103  CO2 25  GLUCOSE 98  BUN 13  CALCIUM 9.2  CREATININE 0.77  GFRNONAA >60  GFRAA >60    LIVER FUNCTION TESTS: No results for input(s): BILITOT, AST, ALT, ALKPHOS, PROT, ALBUMIN in the last 8760 hours.  TUMOR MARKERS: No results for input(s): AFPTM, CEA, CA199, CHROMGRNA in the last 8760 hours.  Assessment and Plan:  Ms. Ezequiel Kayser has done well after uterine artery embolization and is back to her regular activities. We will call her by phone in 3 months and set up a follow-up appointment at 6 months.   Signed: Nicholis Edwards, ART A 07/27/2014, 4:37 PM   I spent a total of  15 Minutes in face to face in clinical  consultation, greater than 50% of which was counseling/coordinating care for uterine artery embolization.

## 2014-07-27 NOTE — Progress Notes (Signed)
1 wk follow up [place].  Continues to experience minimal spotting.  Some cramping does not require Rx pain meds.  Taking Ibuprofen bid.  Was experiencing nausea and vomiting when taking as frequently as recommended.  Appetite and energy improving.

## 2014-10-14 ENCOUNTER — Ambulatory Visit (INDEPENDENT_AMBULATORY_CARE_PROVIDER_SITE_OTHER): Payer: BC Managed Care – PPO | Admitting: Emergency Medicine

## 2014-10-14 ENCOUNTER — Ambulatory Visit (INDEPENDENT_AMBULATORY_CARE_PROVIDER_SITE_OTHER): Payer: BC Managed Care – PPO

## 2014-10-14 VITALS — BP 118/76 | HR 83 | Temp 97.5°F | Resp 16 | Ht 61.0 in | Wt 124.4 lb

## 2014-10-14 DIAGNOSIS — R071 Chest pain on breathing: Secondary | ICD-10-CM

## 2014-10-14 MED ORDER — HYDROCODONE-ACETAMINOPHEN 5-325 MG PO TABS: 1.0000 | ORAL_TABLET | ORAL | Status: DC | PRN

## 2014-10-14 MED ORDER — NAPROXEN SODIUM 550 MG PO TABS: 550.0000 mg | ORAL_TABLET | Freq: Two times a day (BID) | ORAL | Status: DC

## 2014-10-14 NOTE — Progress Notes (Signed)
Subjective:  Patient ID: [facility], female    DOB: 1970/04/16  Age: 44 y.o. MRN: [id number]  CC: rib pain   HPI [facility] presents  chest pain in the left inframammary anterior chest. She has been present for the last 24 hours. She has no history of injury or overuse. She has caught a pain increasing when she coughs takes deep breath or sneezes. She has no fever or chills. No wheezing or shortness of breath. She has no coryza. She's not had any lengthy travel. Hasn't had surgery or been under anesthesia or been immobilized recently. She had no improvement of pain with over-the-counter medication  History Atia has a past medical history of Uterine fibroid; Rosacea; and Hyperlipidemia.   She has past surgical history that includes Breast surgery; Fracture surgery; and Uterine fibroid embolization.   Her  family history includes Hypertension in her mother; Hypothyroidism in her sister.  She   reports that she has never smoked. She has never used smokeless tobacco. She reports that she drinks about 0.6 oz of alcohol per week. She reports that she does not use illicit drugs.  Outpatient Prescriptions Prior to Visit  Medication Sig Dispense Refill  . docusate sodium (COLACE) 100 MG capsule Take 1 capsule (100 mg total) by mouth 2 (two) times daily. (Patient not taking: Reported on 10/14/2014) 10 capsule 0  . HYDROcodone-acetaminophen (NORCO/VICODIN) 5-325 MG per tablet Take 1-2 tablets by mouth every 6 (six) hours as needed for moderate pain. (Patient not taking: Reported on 10/14/2014) 30 tablet 0  . ibuprofen (ADVIL,MOTRIN) 600 MG tablet Take 1 tablet (600 mg total) by mouth every 6 (six) hours as needed for cramping. (Patient not taking: Reported on 10/14/2014) 30 tablet 0  . ondansetron (ZOFRAN) 4 MG tablet Take 1 tablet (4 mg total) by mouth every 8 (eight) hours as needed for nausea or vomiting. (Patient not taking: Reported on 10/14/2014) 20 tablet 0   No  facility-administered medications prior to visit.    Social History   Social History  . Marital Status: Single    Spouse Name: N/A  . Number of Children: N/A  . Years of Education: N/A   Social History Main Topics  . Smoking status: Never Smoker   . Smokeless tobacco: Never Used  . Alcohol Use: 0.6 oz/week    1 Standard drinks or equivalent per week  . Drug Use: No  . Sexual Activity: Not Asked   Other Topics Concern  . None   Social History Narrative     Review of Systems  Constitutional: Negative for fever, chills and appetite change.  HENT: Negative for congestion, ear pain, postnasal drip, sinus pressure and sore throat.   Eyes: Negative for pain and redness.  Respiratory: Negative for cough, shortness of breath and wheezing.   Cardiovascular: Negative for leg swelling.  Gastrointestinal: Negative for nausea, vomiting, abdominal pain, diarrhea, constipation and blood in stool.  Endocrine: Negative for polyuria.  Genitourinary: Negative for dysuria, urgency, frequency and flank pain.  Musculoskeletal: Negative for gait problem.  Skin: Negative for rash.  Neurological: Negative for weakness and headaches.  Psychiatric/Behavioral: Negative for confusion and decreased concentration. The patient is not nervous/anxious.     Objective:  BP 118/76 mmHg  Pulse 83  Temp(Src) 97.5 F (36.4 C) (Oral)  Resp 16  Ht 5\' 1"  (1.549 m)  Wt 124 lb 6.4 oz (56.427 kg)  BMI 23.52 kg/m2  SpO2 98%  LMP 04/19/2014 (Approximate)  Physical Exam  Constitutional: She is oriented to person, place, and time. She appears well-developed and well-nourished. No distress.  HENT:  Head: Normocephalic and atraumatic.  Right Ear: External ear normal.  Left Ear: External ear normal.  Nose: Nose normal.  Eyes: Conjunctivae and EOM are normal. Pupils are equal, round, and reactive to light. No scleral icterus.  Neck: Normal range of motion. Neck supple. No tracheal deviation present.    Cardiovascular: Normal rate, regular rhythm and normal heart sounds.   Pulmonary/Chest: Effort normal. No respiratory distress. She has no wheezes. She has no rales. She exhibits tenderness. She exhibits no crepitus, no deformity and no retraction.    Abdominal: She exhibits no mass. There is no tenderness. There is no rebound and no guarding.  Musculoskeletal: She exhibits no edema.  Lymphadenopathy:    She has no cervical adenopathy.  Neurological: She is alert and oriented to person, place, and time. Coordination normal.  Skin: Skin is warm and dry. No rash noted.  Psychiatric: She has a normal mood and affect. Her behavior is normal.      Assessment & Plan:   Masayo was seen today for rib pain.  Diagnoses and all orders for this visit:  Chest pain on breathing -     DG Chest 2 View; Future   I have discontinued Ms. [place]'s docusate sodium, HYDROcodone-acetaminophen, ibuprofen, and ondansetron.  No orders of the defined types were placed in this encounter.    Appropriate red flag conditions were discussed with the patient as well as actions that should be taken.  Patient expressed his understanding.  Follow-up: No Follow-up on file.  Roselee Culver, MD   [facility] reading (PRIMARY) by  Dr. Ouida Sills.  Negative chest.

## 2014-10-14 NOTE — Patient Instructions (Signed)

## 2014-10-15 ENCOUNTER — Telehealth: Payer: Self-pay

## 2014-10-15 NOTE — Telephone Encounter (Signed)
Pt is having a side effect of medication that was given yesterday by dr Ouida Sills

## 2014-10-16 NOTE — Telephone Encounter (Signed)
Left message for her to call me back. 

## 2014-10-18 NOTE — Telephone Encounter (Signed)
Patient is looking for something else for her pain.  She states that the medication made her throw up.  She doesn't know which one made her sick and stopped taking them both.  Is afraid to take either one now and would like something else

## 2014-10-18 NOTE — Telephone Encounter (Signed)
Patient understands.

## 2014-10-18 NOTE — Telephone Encounter (Signed)
Was given several medications: Hydrocodone for pain, ibuprofen for inflammation (which then causes pain) and ondansetron for nausea.  Advise the patient to STOP the hydrocodone, continue the ibuprofen and ondansetron. Ondansetron can cause constipation, so take the stool softener with this.   If the nausea does not resolve, RTC.

## 2015-01-04 ENCOUNTER — Other Ambulatory Visit (HOSPITAL_COMMUNITY): Payer: Self-pay | Admitting: Interventional Radiology

## 2015-01-04 DIAGNOSIS — D259 Leiomyoma of uterus, unspecified: Secondary | ICD-10-CM

## 2015-01-26 ENCOUNTER — Ambulatory Visit (INDEPENDENT_AMBULATORY_CARE_PROVIDER_SITE_OTHER): Payer: BC Managed Care – PPO | Admitting: Emergency Medicine

## 2015-01-26 VITALS — BP 118/76 | HR 107 | Temp 97.5°F | Resp 16 | Ht 61.0 in | Wt 128.0 lb

## 2015-01-26 DIAGNOSIS — R350 Frequency of micturition: Secondary | ICD-10-CM | POA: Diagnosis not present

## 2015-01-26 DIAGNOSIS — R319 Hematuria, unspecified: Secondary | ICD-10-CM

## 2015-01-26 DIAGNOSIS — R81 Glycosuria: Secondary | ICD-10-CM | POA: Diagnosis not present

## 2015-01-26 DIAGNOSIS — R309 Painful micturition, unspecified: Secondary | ICD-10-CM | POA: Diagnosis not present

## 2015-01-26 DIAGNOSIS — N3001 Acute cystitis with hematuria: Secondary | ICD-10-CM | POA: Diagnosis not present

## 2015-01-26 LAB — POCT URINALYSIS DIP (MANUAL ENTRY)
Bilirubin, UA: NEGATIVE
Glucose, UA: 100 — AB
Ketones, POC UA: NEGATIVE
NITRITE UA: NEGATIVE
PH UA: 7
PROTEIN UA: NEGATIVE
SPEC GRAV UA: 1.015
UROBILINOGEN UA: 0.2

## 2015-01-26 LAB — POC MICROSCOPIC URINALYSIS (UMFC): MUCUS RE: ABSENT

## 2015-01-26 LAB — POCT GLYCOSYLATED HEMOGLOBIN (HGB A1C): HEMOGLOBIN A1C: 5.6

## 2015-01-26 LAB — GLUCOSE, POCT (MANUAL RESULT ENTRY): POC GLUCOSE: 80 mg/dL (ref 70–99)

## 2015-01-26 MED ORDER — PHENAZOPYRIDINE HCL 200 MG PO TABS: 200.0000 mg | ORAL_TABLET | Freq: Three times a day (TID) | ORAL | Status: DC | PRN

## 2015-01-26 MED ORDER — CIPROFLOXACIN HCL 500 MG PO TABS: 500.0000 mg | ORAL_TABLET | Freq: Two times a day (BID) | ORAL | Status: DC

## 2015-01-26 NOTE — Patient Instructions (Signed)

## 2015-01-26 NOTE — Progress Notes (Signed)
Subjective:  Patient ID: [facility], female    DOB: [birth year]  Age: 44 y.o. MRN: [id number]  CC: Urinary Frequency; urinary discomfort; and Hematuria   HPI [facility] presents  patient has dysuria urgency and frequency. She said that her blood is dark in color. She finished her period the last day or 2. She has no fever or chills. No back pain. No nausea vomiting. No abdominal pain. She has no vaginal discharge or dyspareunia or pelvic pain. She is known not known to be a diabetic  History Ariana Edwards has a past medical history of Uterine fibroid; Rosacea; and Hyperlipidemia.   She has past surgical history that includes Breast surgery; Fracture surgery; and Uterine fibroid embolization.   Her  family history includes Hypertension in her mother; Hypothyroidism in her sister.  She   reports that she has never smoked. She has never used smokeless tobacco. She reports that she drinks about 0.6 oz of alcohol per week. She reports that she does not use illicit drugs.  Outpatient Prescriptions Prior to Visit  Medication Sig Dispense Refill  . HYDROcodone-acetaminophen (NORCO) 5-325 MG per tablet Take 1-2 tablets by mouth every 4 (four) hours as needed. (Patient not taking: Reported on 01/26/2015) 20 tablet 0  . naproxen sodium (ANAPROX DS) 550 MG tablet Take 1 tablet (550 mg total) by mouth 2 (two) times daily with a meal. (Patient not taking: Reported on 01/26/2015) 40 tablet 0   No facility-administered medications prior to visit.    Social History   Social History  . Marital Status: Single    Spouse Name: N/A  . Number of Children: N/A  . Years of Education: N/A   Social History Main Topics  . Smoking status: Never Smoker   . Smokeless tobacco: Never Used  . Alcohol Use: 0.6 oz/week    1 Standard drinks or equivalent per week  . Drug Use: No  . Sexual Activity: Not Asked   Other Topics Concern  . None   Social History Narrative     Review of  Systems  Constitutional: Negative for fever, chills and appetite change.  HENT: Negative for congestion, ear pain, postnasal drip, sinus pressure and sore throat.   Eyes: Negative for pain and redness.  Respiratory: Negative for cough, shortness of breath and wheezing.   Cardiovascular: Negative for leg swelling.  Gastrointestinal: Negative for nausea, vomiting, abdominal pain, diarrhea, constipation and blood in stool.  Endocrine: Negative for polyuria.  Genitourinary: Positive for dysuria, urgency and frequency. Negative for flank pain.  Musculoskeletal: Negative for gait problem.  Skin: Negative for rash.  Neurological: Negative for weakness and headaches.  Psychiatric/Behavioral: Negative for confusion and decreased concentration. The patient is not nervous/anxious.     Objective:  BP 118/76 mmHg  Pulse 107  Temp(Src) 97.5 F (36.4 C) (Oral)  Resp 16  Ht 5\' 1"  (1.549 m)  Wt 128 lb (58.06 kg)  BMI 24.20 kg/m2  SpO2 97%  LMP 01/18/2015 (Exact Date)  Physical Exam  Constitutional: She is oriented to person, place, and time. She appears well-developed and well-nourished.  HENT:  Head: Normocephalic and atraumatic.  Eyes: Conjunctivae are normal. Pupils are equal, round, and reactive to light.  Pulmonary/Chest: Effort normal.  Musculoskeletal: She exhibits no edema.  Neurological: She is alert and oriented to person, place, and time.  Skin: Skin is dry.  Psychiatric: She has a normal mood and affect. Her behavior is normal. Thought content normal.  Assessment & Plan:   Ariana Edwards was seen today for urinary frequency, urinary discomfort and hematuria.  Diagnoses and all orders for this visit:  Acute cystitis with hematuria -     POCT Microscopic Urinalysis ([facility]) -     POCT urinalysis dipstick  Urinary pain -     POCT Microscopic Urinalysis ([facility]) -     POCT urinalysis dipstick  Hematuria -     POCT Microscopic Urinalysis ([facility]) -     POCT urinalysis  dipstick  Glycosuria -     POCT glucose (manual entry) -     POCT glycosylated hemoglobin (Hb A1C)  Other orders -     ciprofloxacin (CIPRO) 500 MG tablet; Take 1 tablet (500 mg total) by mouth 2 (two) times daily. -     phenazopyridine (PYRIDIUM) 200 MG tablet; Take 1 tablet (200 mg total) by mouth 3 (three) times daily as needed.   I have discontinued Ariana Edwards's naproxen sodium and HYDROcodone-acetaminophen. I am also having her start on ciprofloxacin and phenazopyridine.  Meds ordered this encounter  Medications  . ciprofloxacin (CIPRO) 500 MG tablet    Sig: Take 1 tablet (500 mg total) by mouth 2 (two) times daily.    Dispense:  20 tablet    Refill:  0  . phenazopyridine (PYRIDIUM) 200 MG tablet    Sig: Take 1 tablet (200 mg total) by mouth 3 (three) times daily as needed.    Dispense:  6 tablet    Refill:  0    Appropriate red flag conditions were discussed with the patient as well as actions that should be taken.  Patient expressed his understanding.  Follow-up: Return if symptoms worsen or fail to improve.  Roselee Culver, MD   Results for orders placed or performed in visit on 01/26/15  POCT Microscopic Urinalysis ([facility])  Result Value Ref Range   WBC,UR,HPF,POC Moderate (A) None WBC/hpf   RBC,UR,HPF,POC Moderate (A) None RBC/hpf   Bacteria Few (A) None, Too numerous to count   Mucus Absent Absent   Epithelial Cells, UR Per Microscopy Few (A) None, Too numerous to count cells/hpf  POCT urinalysis dipstick  Result Value Ref Range   Color, UA yellow yellow   Clarity, UA clear clear   Glucose, UA =100 (A) negative   Bilirubin, UA negative negative   Ketones, POC UA negative negative   Spec Grav, UA 1.015    Blood, UA large (A) negative   pH, UA 7.0    Protein Ur, POC negative negative   Urobilinogen, UA 0.2    Nitrite, UA Negative Negative   Leukocytes, UA small (1+) (A) Negative  POCT glucose (manual entry)  Result Value Ref Range   POC  Glucose 80 70 - 99 mg/dl  POCT glycosylated hemoglobin (Hb A1C)  Result Value Ref Range   Hemoglobin A1C 5.6    Results for orders placed or performed in visit on 01/26/15  POCT Microscopic Urinalysis ([facility])  Result Value Ref Range   WBC,UR,HPF,POC Moderate (A) None WBC/hpf   RBC,UR,HPF,POC Moderate (A) None RBC/hpf   Bacteria Few (A) None, Too numerous to count   Mucus Absent Absent   Epithelial Cells, UR Per Microscopy Few (A) None, Too numerous to count cells/hpf  POCT urinalysis dipstick  Result Value Ref Range   Color, UA yellow yellow   Clarity, UA clear clear   Glucose, UA =100 (A) negative   Bilirubin, UA negative negative   Ketones, POC UA negative negative  Spec Grav, UA 1.015    Blood, UA large (A) negative   pH, UA 7.0    Protein Ur, POC negative negative   Urobilinogen, UA 0.2    Nitrite, UA Negative Negative   Leukocytes, UA small (1+) (A) Negative  POCT glucose (manual entry)  Result Value Ref Range   POC Glucose 80 70 - 99 mg/dl  POCT glycosylated hemoglobin (Hb A1C)  Result Value Ref Range   Hemoglobin A1C 5.6

## 2015-02-09 ENCOUNTER — Other Ambulatory Visit: Payer: BC Managed Care – PPO

## 2015-03-07 ENCOUNTER — Other Ambulatory Visit (HOSPITAL_COMMUNITY): Payer: Self-pay | Admitting: Interventional Radiology

## 2015-03-07 ENCOUNTER — Ambulatory Visit
Admission: RE | Admit: 2015-03-07 | Discharge: 2015-03-07 | Disposition: A | Discharge status: Home / Self Care | Payer: BC Managed Care – PPO | Source: Ambulatory Visit | Attending: Interventional Radiology | Admitting: Interventional Radiology

## 2015-03-07 DIAGNOSIS — D259 Leiomyoma of uterus, unspecified: Secondary | ICD-10-CM

## 2015-03-07 HISTORY — PX: IR GENERIC HISTORICAL: IMG1180011

## 2015-03-07 NOTE — Progress Notes (Signed)
Chief Complaint: Patient was seen in consultation today for  Chief Complaint  Patient presents with  . Follow-up    7 mo follow up [place]   at the request of Aalaya Yadao  Referring Physician(s): Jester Klingberg  History of Present Illness: [place] is a 45 y.o. female who is 7 months post uterine fibroid embolization. Her symptoms prior to treatment were primarily related to menstrual cramping, bloating, and dyspareunia. Her cramping has resolved and she has not experienced any menorrhagia. Her bloating and dyspareunia persist. She also feels a masslike sensation that she attributes to stable size of her uterus. She denies any fevers or chills or foul discharge.  Past Medical History  Diagnosis Date  . Uterine fibroid   . Rosacea   . Hyperlipidemia     Past Surgical History  Procedure Laterality Date  . Breast surgery    . Fracture surgery    . Uterine fibroid embolization      Allergies: Sulfa antibiotics  Medications: Prior to Admission medications   Medication Sig Start Date End Date Taking? Authorizing Provider  ciprofloxacin (CIPRO) 500 MG tablet Take 1 tablet (500 mg total) by mouth 2 (two) times daily. 01/26/15   Roselee Culver, MD  phenazopyridine (PYRIDIUM) 200 MG tablet Take 1 tablet (200 mg total) by mouth 3 (three) times daily as needed. 01/26/15   Roselee Culver, MD     Family History  Problem Relation Age of Onset  . Hypertension Mother   . Hypothyroidism Sister     Social History   Social History  . Marital Status: Single    Spouse Name: N/A  . Number of Children: N/A  . Years of Education: N/A   Social History Main Topics  . Smoking status: Never Smoker   . Smokeless tobacco: Never Used  . Alcohol Use: 0.6 oz/week    1 Standard drinks or equivalent per week  . Drug Use: No  . Sexual Activity: Not on file   Other Topics Concern  . Not on file   Social History Narrative     Review of Systems: A 12 point ROS  discussed and pertinent positives are indicated in the HPI above.  All other systems are negative.  Review of Systems  Vital Signs: BP 115/66 mmHg  Pulse 83  Temp(Src) 98.8 F (37.1 C) (Oral)  Resp 15  LMP 02/19/2015 (Approximate)  Physical Exam  Constitutional: She is oriented to person, place, and time. She appears well-developed and well-nourished.  Abdominal: Soft.  No obvious mass as would be expected with an enlarged uterus.  Neurological: She is alert and oriented to person, place, and time.    Imaging: No results found.  Labs:  CBC:  Recent Labs  07/20/14 0820  WBC 8.9  HGB 14.5  HCT 43.4  PLT 275    COAGS:  Recent Labs  07/20/14 0820  INR 0.95    BMP:  Recent Labs  07/20/14 0820  NA 136  K 4.0  CL 103  CO2 25  GLUCOSE 98  BUN 13  CALCIUM 9.2  CREATININE 0.77  GFRNONAA >60  GFRAA >60    LIVER FUNCTION TESTS: No results for input(s): BILITOT, AST, ALT, ALKPHOS, PROT, ALBUMIN in the last 8760 hours.  TUMOR MARKERS: No results for input(s): AFPTM, CEA, CA199, CHROMGRNA in the last 8760 hours.  Assessment and Plan:  [place] has improved in that her menstrual cramping has resolved. Other symptoms related to bloating and dyspareunia  persist. I have recommended MRI with and without contrast. She wishes to initially perform an ultrasound. At the minimum, we can judge the overall uterine size and evaluate the endometrium as the initial step. It is possible that her fibroids have shrunk especially given her physical exam and that these other symptoms are not related to her fibroids. I will contact her after her ultrasound examination.    Electronically Signed: Rafael Salway, ART A 03/07/2015, 4:35 PM   I spent a total of   15 Minutes in face to face in clinical consultation, greater than 50% of which was counseling/coordinating care for uterine fibroid embolization.

## 2015-03-13 ENCOUNTER — Ambulatory Visit
Admission: RE | Admit: 2015-03-13 | Discharge: 2015-03-13 | Disposition: A | Discharge status: Home / Self Care | Payer: BC Managed Care – PPO | Source: Ambulatory Visit | Attending: Interventional Radiology | Admitting: Interventional Radiology

## 2015-03-13 DIAGNOSIS — D259 Leiomyoma of uterus, unspecified: Secondary | ICD-10-CM

## 2015-03-24 ENCOUNTER — Other Ambulatory Visit (HOSPITAL_COMMUNITY): Payer: Self-pay | Admitting: Interventional Radiology

## 2015-03-24 DIAGNOSIS — D259 Leiomyoma of uterus, unspecified: Secondary | ICD-10-CM

## 2015-04-15 ENCOUNTER — Ambulatory Visit (HOSPITAL_COMMUNITY): Payer: BC Managed Care – PPO

## 2015-04-19 ENCOUNTER — Ambulatory Visit (HOSPITAL_COMMUNITY): Admission: RE | Admit: 2015-04-19 | Payer: BC Managed Care – PPO | Source: Ambulatory Visit

## 2015-05-19 ENCOUNTER — Ambulatory Visit (HOSPITAL_COMMUNITY): Payer: BC Managed Care – PPO

## 2016-01-10 IMAGING — MR MR PELVIS WO/W CM
7 of 10 series · 31 of 48 positions shown · IV contrast (multihance)
Comparison: None.

CLINICAL DATA: Pre [place], uterine fibroids

EXAM:
MRI PELVIS WITHOUT AND WITH CONTRAST
TECHNIQUE: Multiplanar multisequence MR imaging of the pelvis was performed
both before and after administration of intravenous contrast.
CONTRAST:  10mL MULTIHANCE GADOBENATE DIMEGLUMINE 529 MG/ML IV SOLN

[Series 2: T2 · coronal · 6.0mm · 1.41mm/px · 4 of 26 slices shown]
[im 1/26]
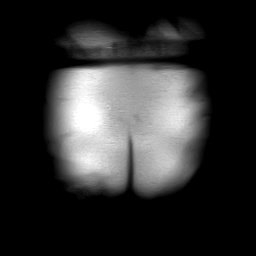
[im 9/26]
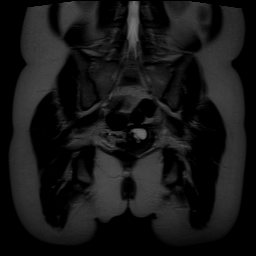
[im 17/26]
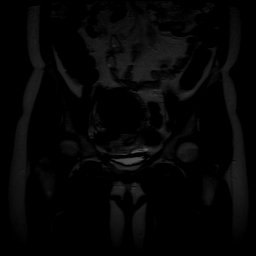
[im 26/26]
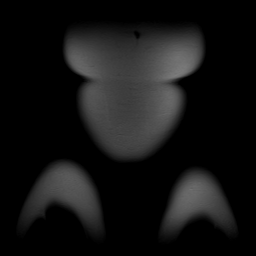

[Series 3: t2_tse axial · axial · 7.0mm · 0.51mm/px · z∈[-105,+113]mm · 4 of 25 slices shown]
[im 1/25]
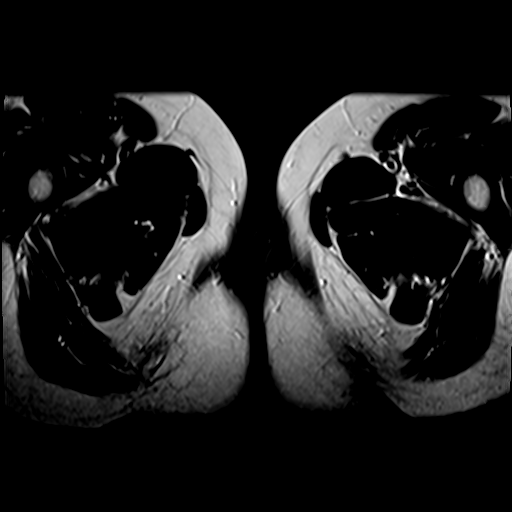
[im 9/25]
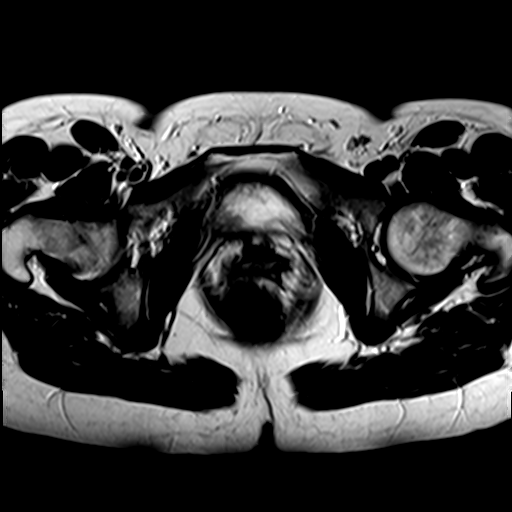
[im 17/25]
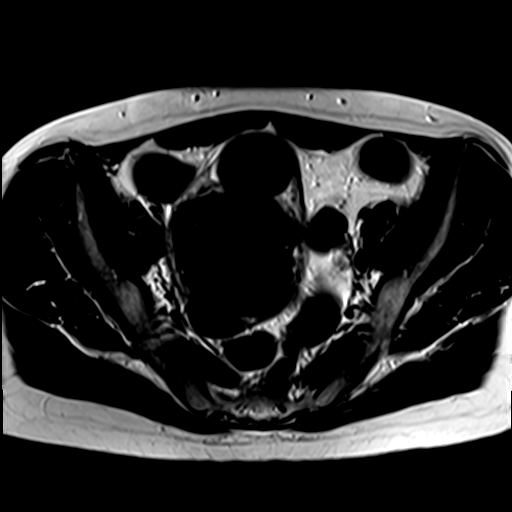
[im 25/25]
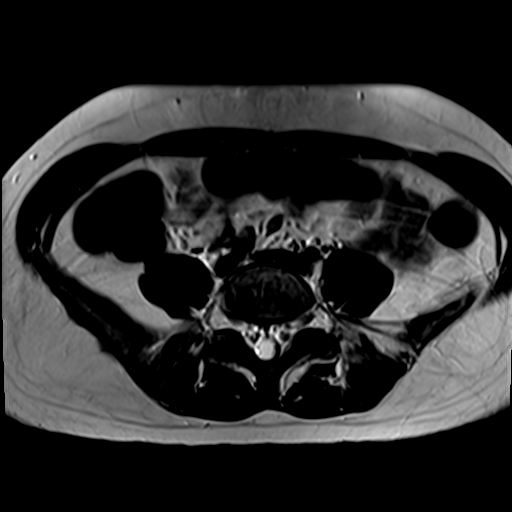

[Series 4: t2_tse axial fs · axial · 7.0mm · 0.51mm/px · z∈[-105,+113]mm · 5 of 25 slices shown]
[im 1/25]
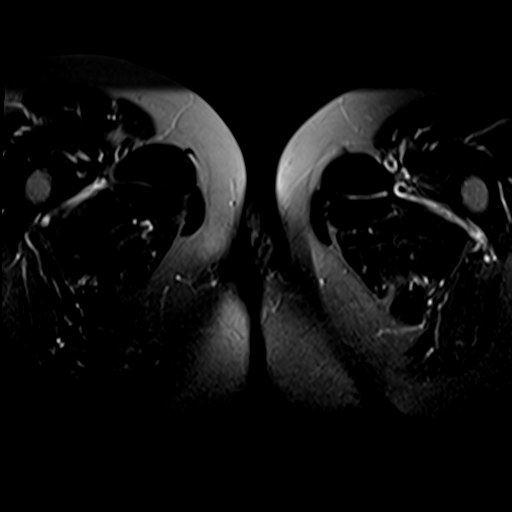
[im 7/25]
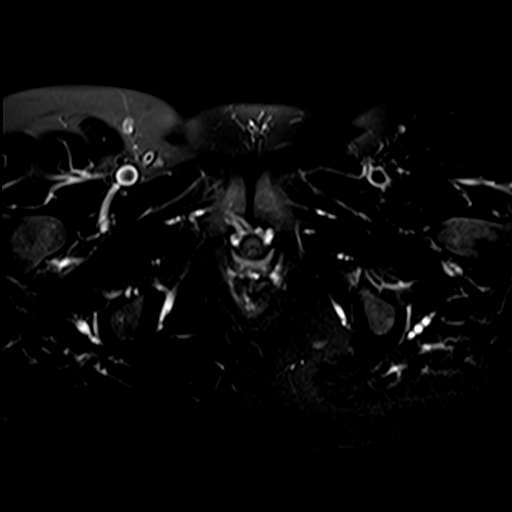
[im 13/25]
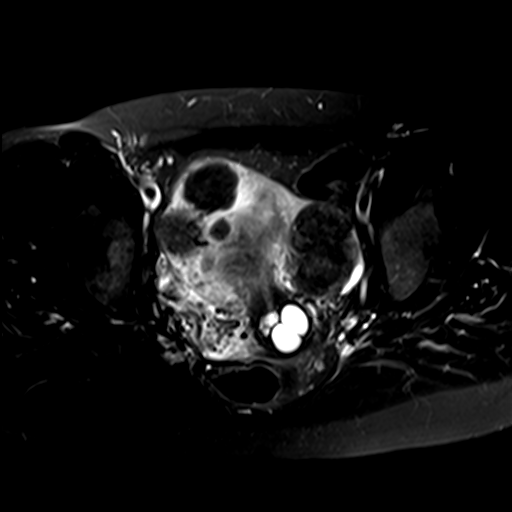
[im 19/25]
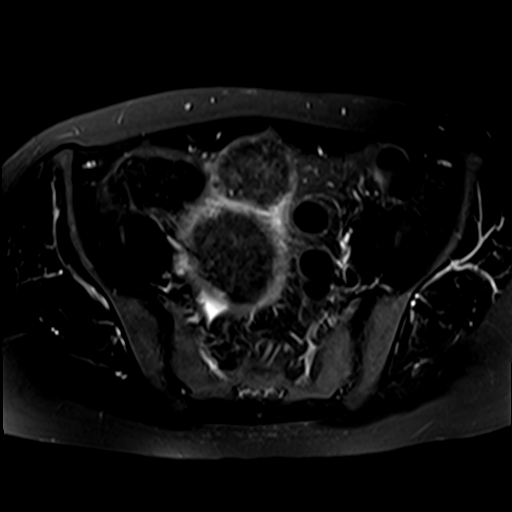
[im 25/25]
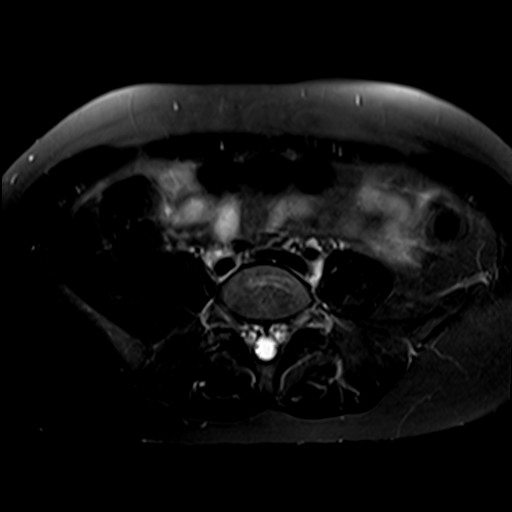

[Series 5: t2_tse_sag · sagittal · 5.0mm · 0.51mm/px · 5 of 30 slices shown]
[im 1/30]
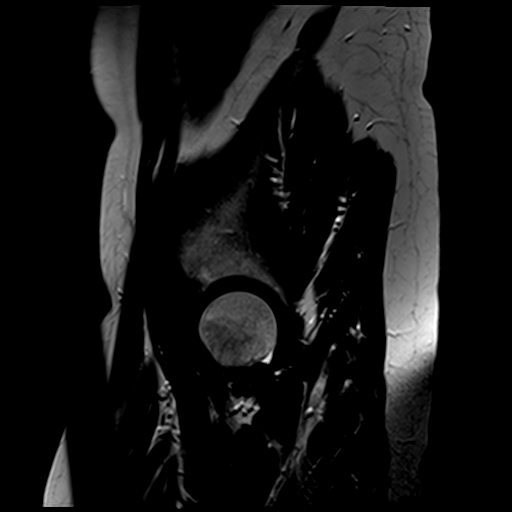
[im 8/30]
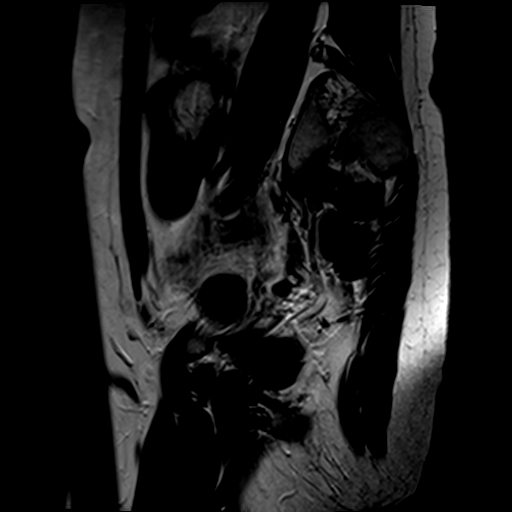
[im 15/30]
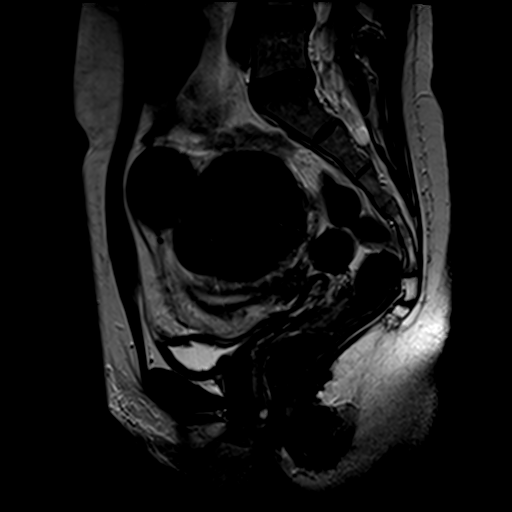
[im 22/30]
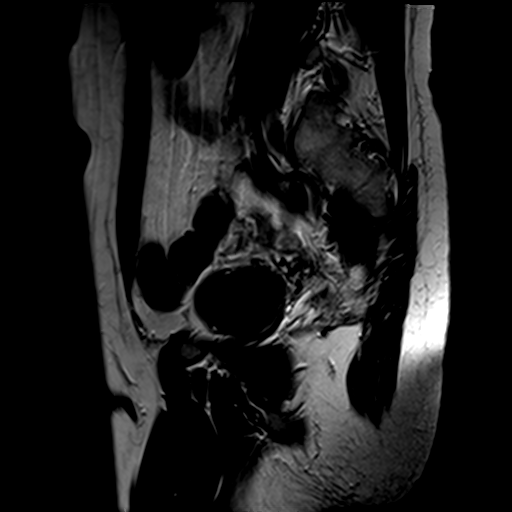
[im 30/30]
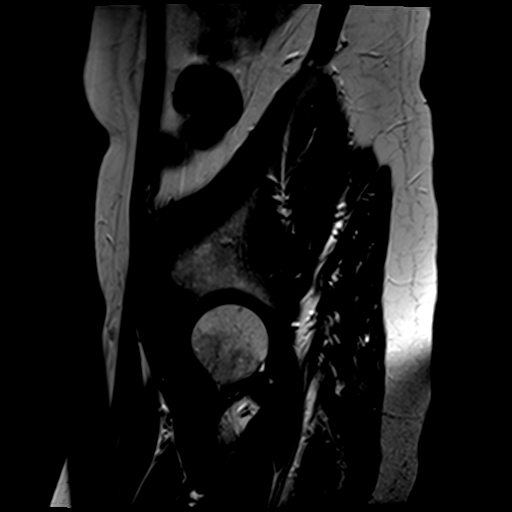

[Series 6: axial spgr-not bh · axial · 7.0mm · 1.02mm/px · z∈[-105,+113]mm · 5 of 25 slices shown]
[im 1/25]
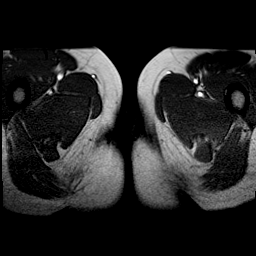
[im 7/25]
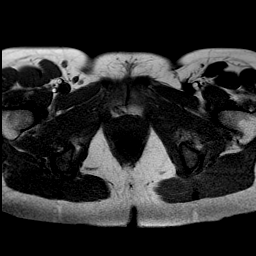
[im 13/25]
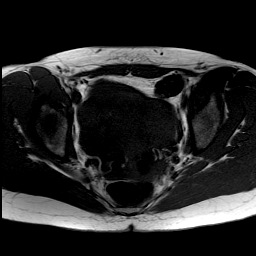
[im 19/25]
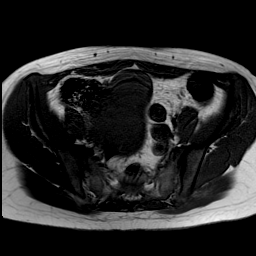
[im 25/25]
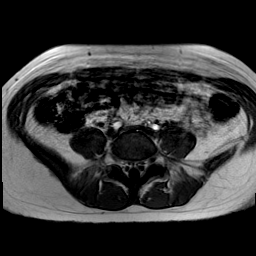

[Series 7: axial spgr fs-pre · axial · non-contrast · 7.0mm · 1.02mm/px · z∈[-105,+113]mm · 5 of 25 slices shown]
[im 1/25]
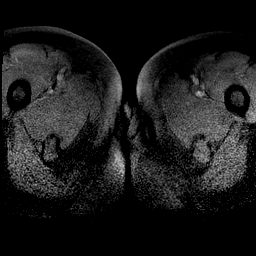
[im 7/25]
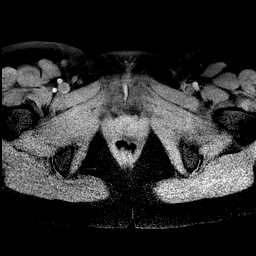
[im 13/25]
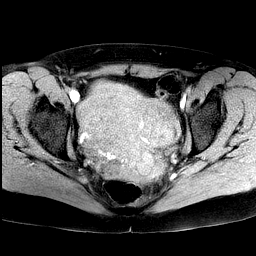
[im 19/25]
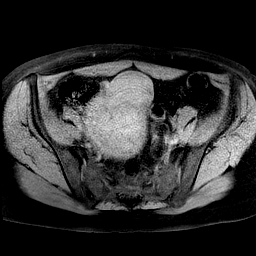
[im 25/25]
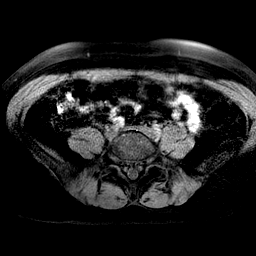

[Series 8: axial spgr fs-post · axial · 7.0mm · 1.02mm/px · z∈[-105,+4]mm · 3 of 25 slices shown]
[im 1/25]
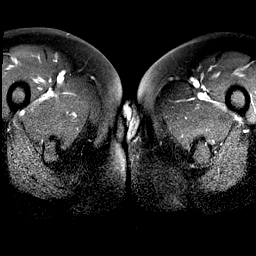
[im 7/25]
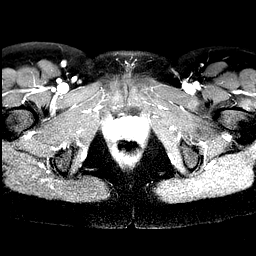
[im 13/25]
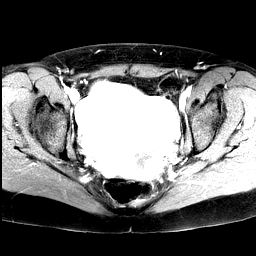

[31 of 48 positions shown; findings below may reference images not displayed]

FINDINGS: Uterus is enlarged, measuring 10.8 x 8.6 x 10.7 cm.

Multiple uterine fibroids, including:

--3.4 x 4.1 x 4.3 cm subserosal posterior fundal fibroid

--6.5 x 5.8 x 6.9 cm intramural posterior uterine body fibroid

--2.5 x 2.8 x 3.3 cm subserosal right anterior fundal fibroid

--2.9 x 2.3 x 3.0 cm subserosal right anterior uterine body fibroid

--4.9 x 3.8 x 4.3 cm subserosal left lower uterine body fibroid

Following contrast administration, all fibroids uniformly enhance.

Endometrial complex is distorted by multiple fibroids but measures
approximately 8 mm in maximal dimension (series 11/image 15).

Multiple complex nabothian cysts (series 11/image 18).

Left ovary is within normal limits, measuring 2.0 x 2.0 x 1.8 cm.

Right ovary is poorly visualized but grossly measures approximately
2.1 x 1.0 x 1.7 cm.

No pelvis ascites.

No pelvis lymphadenopathy.

No focal osseous lesions.
IMPRESSION: Multiple uterine fibroids, measuring up to 6.9 cm, as above.

## 2016-01-11 ENCOUNTER — Ambulatory Visit (INDEPENDENT_AMBULATORY_CARE_PROVIDER_SITE_OTHER): Payer: BC Managed Care – PPO | Admitting: Emergency Medicine

## 2016-01-11 ENCOUNTER — Ambulatory Visit (INDEPENDENT_AMBULATORY_CARE_PROVIDER_SITE_OTHER): Payer: BC Managed Care – PPO

## 2016-01-11 VITALS — BP 118/74 | HR 80 | Temp 98.3°F | Ht 61.0 in | Wt 133.0 lb

## 2016-01-11 DIAGNOSIS — Z23 Encounter for immunization: Secondary | ICD-10-CM | POA: Diagnosis not present

## 2016-01-11 DIAGNOSIS — M5489 Other dorsalgia: Secondary | ICD-10-CM | POA: Diagnosis not present

## 2016-01-11 LAB — POCT URINALYSIS DIP (MANUAL ENTRY)
BILIRUBIN UA: NEGATIVE
Bilirubin, UA: NEGATIVE
Glucose, UA: NEGATIVE
Leukocytes, UA: NEGATIVE
Nitrite, UA: NEGATIVE
PH UA: 7
Protein Ur, POC: NEGATIVE
Spec Grav, UA: 1.02
Urobilinogen, UA: 0.2

## 2016-01-11 LAB — POCT URINE PREGNANCY: Preg Test, Ur: NEGATIVE

## 2016-01-11 MED ORDER — CYCLOBENZAPRINE HCL 5 MG PO TABS: ORAL_TABLET | ORAL | 1 refills | Status: DC

## 2016-01-11 NOTE — Patient Instructions (Addendum)
IF you received an x-ray today, you will receive an invoice from [facility]. Please contact [facility] at [phone] with questions or concerns regarding your invoice.   IF you received labwork today, you will receive an invoice from [employer]. Please contact [facility] at [phone] with questions or concerns regarding your invoice.   Our billing staff will not be able to assist you with questions regarding bills from these companies.  You will be contacted with the lab results as soon as they are available. The fastest way to get your results is to activate your My Chart account. Instructions are located on the last page of this paperwork. If you have not heard from [place] regarding the results in 2 weeks, please contact this office.     Low Back Strain A strain is a stretch or tear in a muscle or the strong cords of tissue that attach muscle to bone (tendons). Strains of the lower back (lumbar spine) are a common cause of low back pain. A strain occurs when muscles or tendons are torn or are stretched beyond their limits. The muscles may become inflamed, resulting in pain and sudden muscle tightening (spasms). A strain can happen suddenly due to an injury (trauma), or it can develop gradually due to overuse. There are three types of strains:  Grade 1 is a mild strain involving a minor tear of the muscle fibers or tendons. This may cause some pain but no loss of muscle strength.  Grade 2 is a moderate strain involving a partial tear of the muscle fibers or tendons. This causes more severe pain and some loss of muscle strength.  Grade 3 is a severe strain involving a complete tear of the muscle or tendon. This causes severe pain and complete or nearly complete loss of muscle strength. What are the causes? This condition may be caused by:  Trauma, such as a fall or a hit to the body.  Twisting or overstretching the back. This may result  from doing activities that require a lot of energy, such as lifting heavy objects. What increases the risk? The following factors may increase your risk of getting this condition:  Playing contact sports.  Participating in sports or activities that put excessive stress on the back and require a lot of bending and twisting, including:  Lifting weights or heavy objects.  Gymnastics.  Soccer.  Figure skating.  Snowboarding.  Being overweight or obese.  Having poor strength and flexibility. What are the signs or symptoms? Symptoms of this condition may include:  Sharp or dull pain in the lower back that does not go away. Pain may extend to the buttocks.  Stiffness.  Limited range of motion.  Inability to stand up straight due to stiffness or pain.  Muscle spasms. How is this diagnosed?   This condition may be diagnosed based on:  Your symptoms.  Your medical history.  A physical exam.  Your health care provider may push on certain areas of your back to determine the source of your pain.  You may be asked to bend forward, backward, and side to side to assess the severity of your pain and your range of motion.  Imaging tests, such as:  X-rays.  MRI. How is this treated? Treatment for this condition may include:  Applying heat and cold to the affected area.  Medicines to help relieve pain and to relax your muscles (muscle relaxants).  NSAIDs to help reduce swelling and discomfort.  Physical therapy. When your symptoms improve, it is important to gradually return to your normal routine as soon as possible to reduce pain, avoid stiffness, and avoid loss of muscle strength. Generally, symptoms should improve within 6 weeks of treatment. However, recovery time varies. Follow these instructions at home: Managing pain, stiffness, and swelling  If directed, apply ice to the injured area during the first 24 hours after your injury.  Put ice in a plastic  bag.  Place a towel between your skin and the bag.  Leave the ice on for 20 minutes, 2-3 times a day.  If directed, apply heat to the affected area as often as told by your health care provider. Use the heat source that your health care provider recommends, such as a moist heat pack or a heating pad.  Place a towel between your skin and the heat source.  Leave the heat on for 20-30 minutes.  Remove the heat if your skin turns bright red. This is especially important if you are unable to feel pain, heat, or cold. You may have a greater risk of getting burned. Activity  Rest and return to your normal activities as told by your health care provider. Ask your health care provider what activities are safe for you.  Avoid activities that take a lot of effort (are strenuous) for as long as told by your health care provider.  Do exercises as told by your health care provider. General instructions  Take over-the-counter and prescription medicines only as told by your health care provider.  If you have questions or concerns about safety while taking pain medicine, talk with your health care provider.  Do not drive or operate heavy machinery until you know how your pain medicine affects you.  Do not use any tobacco products, such as cigarettes, chewing tobacco, and e-cigarettes. Tobacco can delay bone healing. If you need help quitting, ask your health care provider.  Keep all follow-up visits as told by your health care provider. This is important. How is this prevented?  Warm up and stretch before being active.  Cool down and stretch after being active.  Give your body time to rest between periods of activity.  Avoid:  Being physically inactive for long periods at a time.  Exercising or playing sports when you are tired or in pain.  Use correct form when playing sports and lifting heavy objects.  Use good posture when sitting and standing.  Maintain a healthy weight.  Sleep  on a mattress with medium firmness to support your back.  Make sure to use equipment that fits you, including shoes that fit well.  Be safe and responsible while being active to avoid falls.  Do at least 150 minutes of moderate-intensity exercise each week, such as brisk walking or water aerobics. Try a form of exercise that takes stress off your back, such as swimming or stationary cycling.  Maintain physical fitness, including:  Strength.  Flexibility.  Cardiovascular fitness.  Endurance. Contact a health care provider if:  Your back pain does not improve after 6 weeks of treatment.  Your symptoms get worse. Get help right away if:  Your back pain is severe.  You are unable to stand or walk.  You develop pain in your legs.  You develop weakness in your buttocks or legs.  You have difficulty controlling when you urinate or when you have a bowel movement. This information is not intended to replace advice given to you by your health care provider.  Make sure you discuss any questions you have with your health care provider. Document Released: 01/21/2005 Document Revised: 09/28/2015 Document Reviewed: 11/02/2014 Elsevier Interactive Patient Education  2017 [employer].

## 2016-01-11 NOTE — Progress Notes (Signed)
By signing my name below, I, Moises Blood, attest that this documentation has been prepared under the direction and in the presence of Arlyss Queen, MD. Electronically Signed: Moises Blood, [place]. 01/11/2016 , 4:56 PM .  Patient was seen in room 13 .  Chief Complaint:  Chief Complaint  Patient presents with  . Back Pain    Right side Lower back and towards the hip X8 days ago    HPI: Ariana Edwards is a 45 y.o. female who reports to [facility] today complaining of right-sided low back pain and into her right hip that started about 8 days ago. Patient states she had back pain that occurred out of nowhere 8 days ago, enough to the point where she couldn't stand up. She had to ask her husband to help her up. She has been able to stand up and walk properly yesterday. Once she starts moving, she is able to do her normal activities, but does still feel pain over the affected area. She reports the pain being deeper, and closer towards her right hip. She denies pain radiating into her legs. She denies any back problems in the past.   She notes lifting and moving a king-sized mattress with her husband. She was also doing exercises at home.   She is still having regular periods. She denies chance of pregnancy.   Past Medical History:  Diagnosis Date  . Hyperlipidemia   . Rosacea   . Uterine fibroid    Past Surgical History:  Procedure Laterality Date  . BREAST SURGERY    . FRACTURE SURGERY    . UTERINE FIBROID EMBOLIZATION     Social History   Social History  . Marital status: Single    Spouse name: N/A  . Number of children: N/A  . Years of education: N/A   Social History Main Topics  . Smoking status: Never Smoker  . Smokeless tobacco: Never Used  . Alcohol use 0.6 oz/week    1 Standard drinks or equivalent per week  . Drug use: No  . Sexual activity: Yes     Comment: no birth control   Other Topics Concern  . None   Social History Narrative  . None   Family  History  Problem Relation Age of Onset  . Hypertension Mother   . Hypothyroidism Sister    Allergies  Allergen Reactions  . Sulfa Antibiotics Rash   Prior to Admission medications   Medication Sig Start Date End Date Taking? Authorizing Provider  ciprofloxacin (CIPRO) 500 MG tablet Take 1 tablet (500 mg total) by mouth 2 (two) times daily. Patient not taking: Reported on 01/11/2016 01/26/15   Roselee Culver, MD  phenazopyridine (PYRIDIUM) 200 MG tablet Take 1 tablet (200 mg total) by mouth 3 (three) times daily as needed. Patient not taking: Reported on 01/11/2016 01/26/15   Roselee Culver, MD     ROS:  Constitutional: negative for fever, chills, night sweats, weight changes, or fatigue  HEENT: negative for vision changes, hearing loss, congestion, rhinorrhea, ST, epistaxis, or sinus pressure Cardiovascular: negative for chest pain or palpitations Respiratory: negative for hemoptysis, wheezing, shortness of breath, or cough Abdominal: negative for abdominal pain, nausea, vomiting, diarrhea, or constipation Dermatological: negative for rash Musc: positive for low back pain (right sided) Neurologic: negative for headache, dizziness, or syncope All other systems reviewed and are otherwise negative with the exception to those above and in the HPI.  PHYSICAL EXAM: Vitals:   01/11/16 1615  BP: 118/74  Pulse: 80  Temp: 98.3 F (36.8 C)   Body mass index is 25.13 kg/m.   General: Alert, no acute distress HEENT:  Normocephalic, atraumatic, oropharynx patent. Eye: [facility] Cardiovascular:  Regular rate and rhythm, no rubs murmurs or gallops.  No Carotid bruits, radial pulse intact. No pedal edema.  Respiratory: Clear to auscultation bilaterally.  No wheezes, rales, or rhonchi.  No cyanosis, no use of accessory musculature Abdominal: No organomegaly, abdomen is soft and non-tender, positive bowel sounds. No masses. Musculoskeletal: Gait intact. No edema, tenderness;  minimal tenderness at lower right lumbar spine just above the SI joint; no swelling Skin: No rashes. Neurologic: Facial musculature symmetric; lower extremity DTR's 2+, negative straight leg raise, motor 5/5 Psychiatric: Patient acts appropriately throughout our interaction.  Lymphatic: No cervical or submandibular lymphadenopathy Genitourinary/Anorectal: No acute findings  LABS: Results for orders placed or performed in visit on 01/11/16  POCT urinalysis dipstick  Result Value Ref Range   Color, UA yellow yellow   Clarity, UA clear clear   Glucose, UA negative negative   Bilirubin, UA negative negative   Ketones, POC UA negative negative   Spec Grav, UA 1.020    Blood, UA trace-lysed (A) negative   pH, UA 7.0    Protein Ur, POC negative negative   Urobilinogen, UA 0.2    Nitrite, UA Negative Negative   Leukocytes, UA Negative Negative  POCT urine pregnancy  Result Value Ref Range   Preg Test, Ur Negative Negative    EKG/XRAY:   Dg Lumbar Spine Complete  Result Date: 01/11/2016 CLINICAL DATA:  Lower right-sided back pain. EXAM: LUMBAR SPINE - COMPLETE 4+ VIEW COMPARISON:  None. FINDINGS: There is no evidence of lumbar spine fracture. Alignment is normal. Intervertebral disc spaces are maintained. IMPRESSION: Negative. Electronically Signed   By: Misty Stanley M.D.   On: 01/11/2016 17:49     ASSESSMENT/PLAN: I feel the patient pulled a muscle in her back with lifting. Will treat with Flexeril at bedtime. She was also given an AVS with back stretching recommendations. Recheck when necessary. Urine and back films as noted were normal.I personally performed the services described in this documentation, which was scribed in my presence. The recorded information has been reviewed and is accurate.  Gross sideeffects, risk and benefits, and alternatives of medications d/w patient. Patient is aware that all medications have potential sideeffects and we are unable to predict every  sideeffect or drug-drug interaction that may occur.  Arlyss Queen MD 01/11/2016 4:41 PM

## 2016-02-07 ENCOUNTER — Encounter: Payer: Self-pay | Admitting: Interventional Radiology

## 2016-02-09 ENCOUNTER — Encounter: Payer: Self-pay | Admitting: Interventional Radiology

## 2016-11-07 IMAGING — US US TRANSVAGINAL NON-OB
1 series · 13 of 25 positions shown · non-contrast
Comparison: None

CLINICAL DATA: Pain with intercourse.

EXAM:
TRANSABDOMINAL AND TRANSVAGINAL ULTRASOUND OF PELVIS
TECHNIQUE: Both transabdominal and transvaginal ultrasound examinations of the
pelvis were performed. Transabdominal technique was performed for
global imaging of the pelvis including uterus, ovaries, adnexal
regions, and pelvic cul-de-sac. It was necessary to proceed with
endovaginal exam following the transabdominal exam to visualize the
endometrium and ovaries.

[Series 1: us transvaginal non-ob · 0.28mm/px · 13 of 116 slices shown]
[im 1/116]
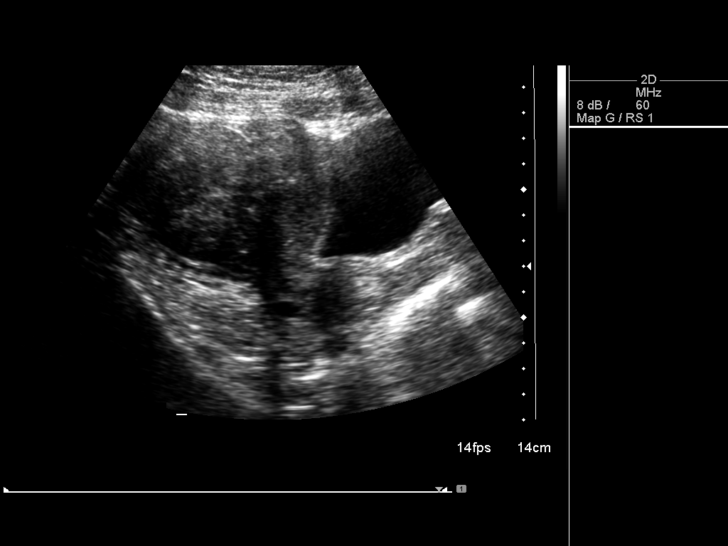
[im 10/116]
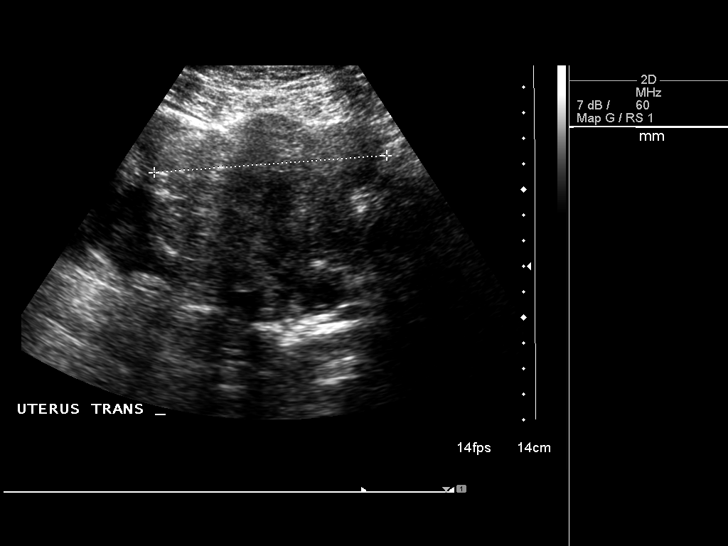
[im 20/116]
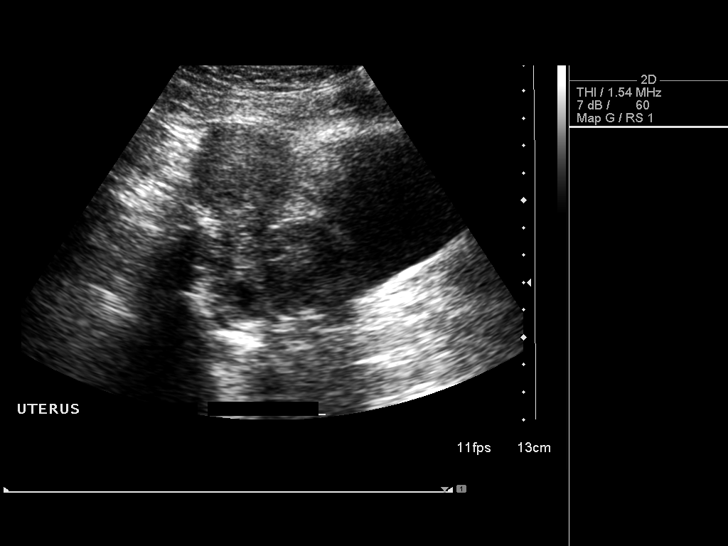
[im 29/116]
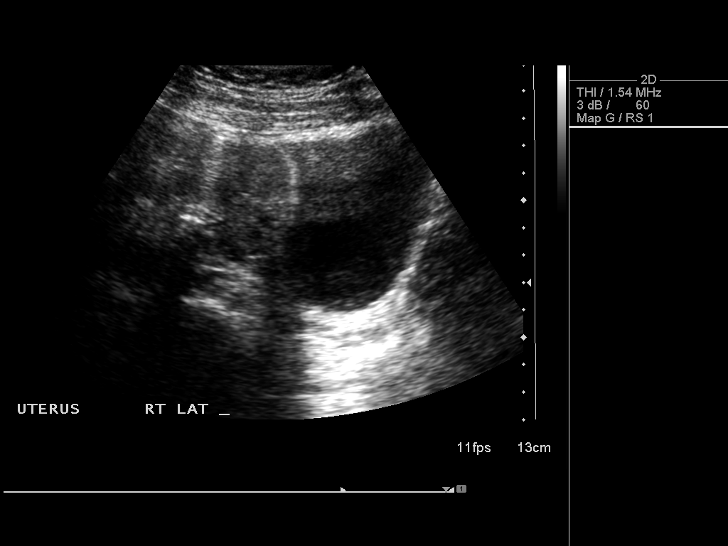
[im 39/116]
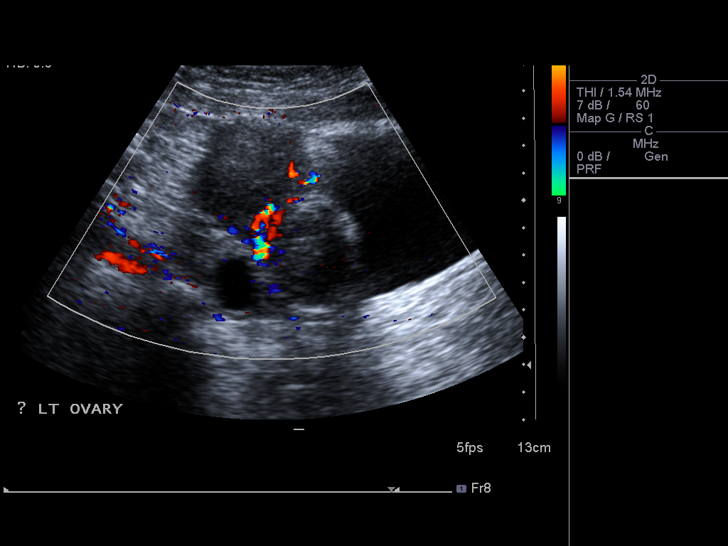
[im 48/116]
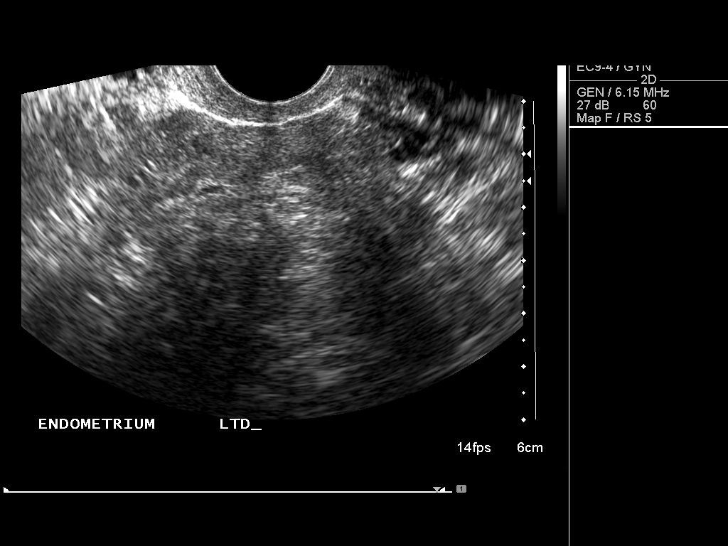
[im 58/116]
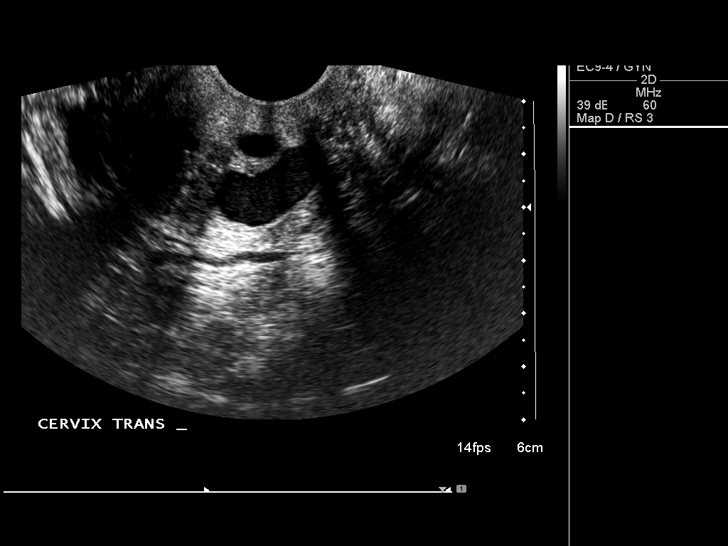
[im 68/116]
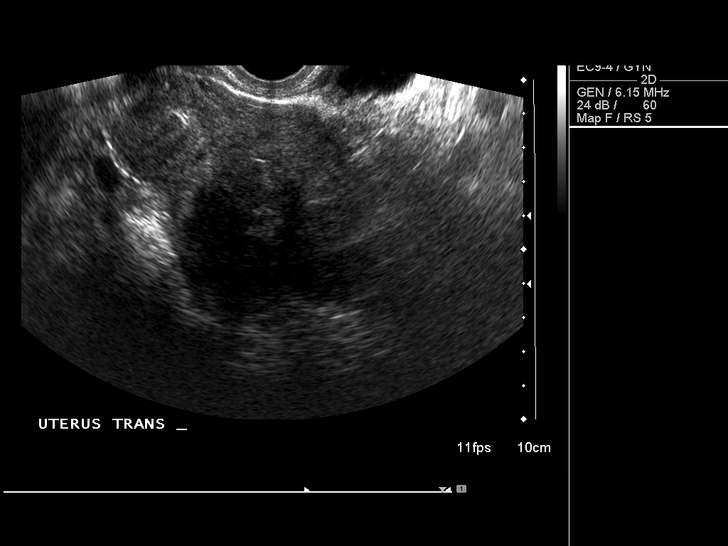
[im 77/116]
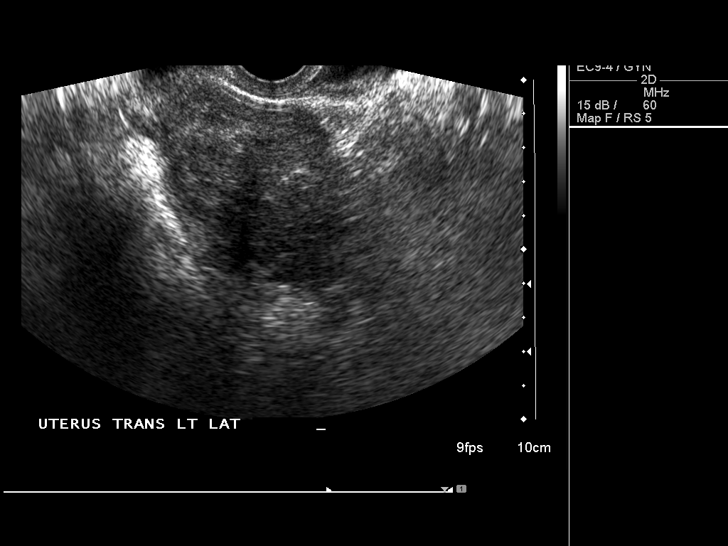
[im 87/116]
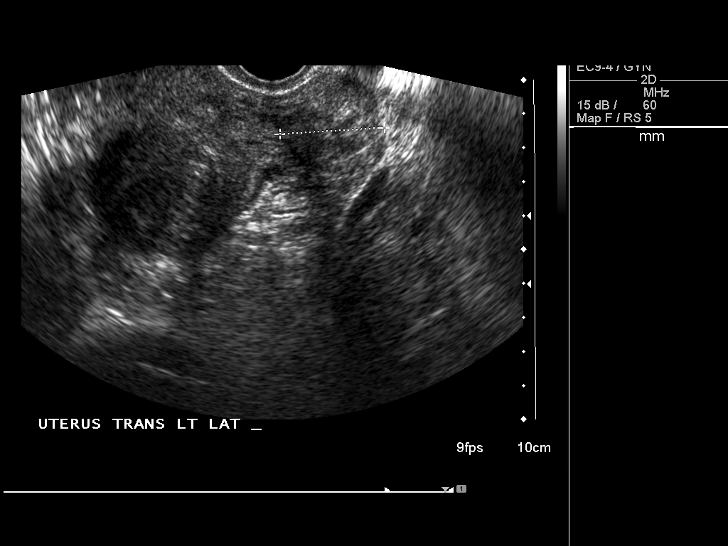
[im 96/116]
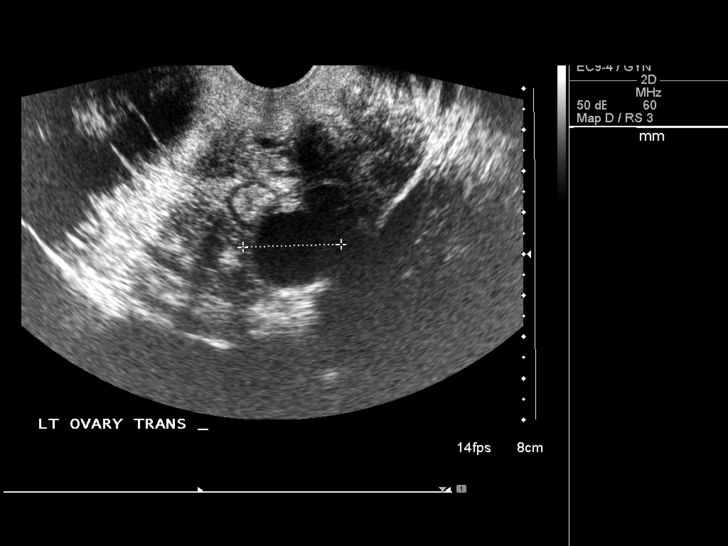
[im 106/116]
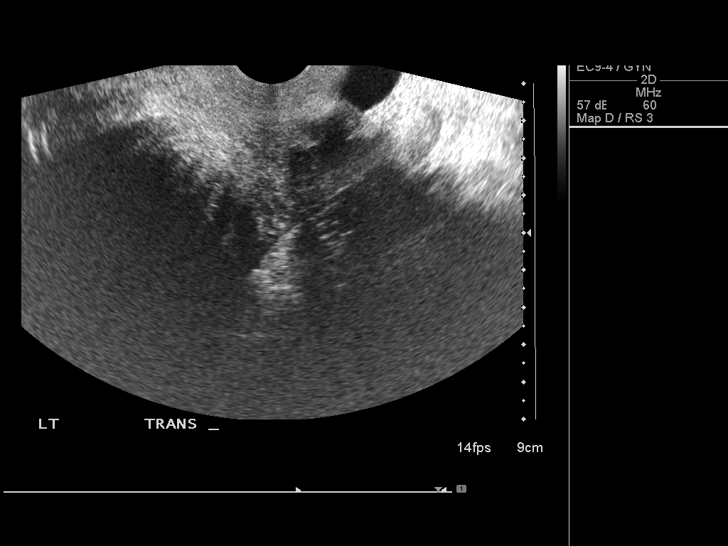
[im 116/116]
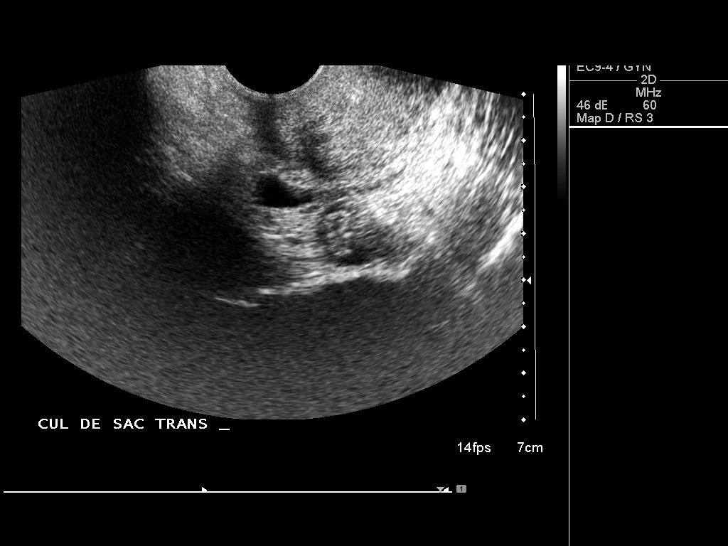

[13 of 25 positions shown; findings below may reference images not displayed]

FINDINGS: The uterus measures 10 x 10 x 9 cm. Again noted are multiple uterine
fibroids. These were better characterized on the MRI from Tuesday May, 2014. The intramural posterior uterine fibroid measures 6.2 x 5.6 x
6.1 cm today versus 6.5 x 5.8 x 6.9 cm on the comparison MRI. Four
other fibroids are also identified. These were noted to be
subserosal on the previous MRI. The subserosal fibroid in the right
uterine fundus measures 2.5 x 2.4 x 2.7 cm. The fibroid near midline
of the fundus measures 2.4 x 2.8 x 2.9 cm. The fibroid at the left
side of the fundus measures 3.7 x 3.9 x 3.2 cm. The fibroid in the
left uterine body measures 3.1 x 3.3 x 3.1 cm.

The endometrial stripe complex measures 1.5 mm. There is a small
amount of fluid in the endometrial canal. Multiple nabothian cysts
are identified. The largest in is complicated with internal debris.

The right ovary is normal in appearance with multiple follicles.

The left ovary measures 4 x 2.2 x 2.4 cm. There is a 2 cm
cyst/follicle in the left ovary of no significance. A complex region
with a thickened wall in the left ovary measuring up to 1.6 cm is
likely a collapsing cyst.
IMPRESSION: 1. Fibroid uterus as described above. Comparison is difficult with
the May 2014 MRI given difference in modality. The largest
intramural posterior fibroid measures a little smaller in the
interval.

## 2016-12-31 ENCOUNTER — Ambulatory Visit: Payer: BC Managed Care – PPO | Admitting: Physician Assistant

## 2016-12-31 ENCOUNTER — Other Ambulatory Visit: Payer: Self-pay

## 2016-12-31 ENCOUNTER — Encounter: Payer: Self-pay | Admitting: Physician Assistant

## 2016-12-31 VITALS — BP 118/76 | HR 91 | Temp 98.0°F | Resp 18 | Ht 61.0 in | Wt 132.6 lb

## 2016-12-31 DIAGNOSIS — R300 Dysuria: Secondary | ICD-10-CM

## 2016-12-31 DIAGNOSIS — R350 Frequency of micturition: Secondary | ICD-10-CM | POA: Diagnosis not present

## 2016-12-31 DIAGNOSIS — R102 Pelvic and perineal pain: Secondary | ICD-10-CM

## 2016-12-31 DIAGNOSIS — N909 Noninflammatory disorder of vulva and perineum, unspecified: Secondary | ICD-10-CM

## 2016-12-31 DIAGNOSIS — R82998 Other abnormal findings in urine: Secondary | ICD-10-CM

## 2016-12-31 DIAGNOSIS — N898 Other specified noninflammatory disorders of vagina: Secondary | ICD-10-CM | POA: Diagnosis not present

## 2016-12-31 DIAGNOSIS — N9089 Other specified noninflammatory disorders of vulva and perineum: Secondary | ICD-10-CM

## 2016-12-31 LAB — POC MICROSCOPIC URINALYSIS (UMFC): Mucus: ABSENT

## 2016-12-31 LAB — POCT WET + KOH PREP
Trich by wet prep: ABSENT
YEAST BY KOH: ABSENT
Yeast by wet prep: ABSENT

## 2016-12-31 LAB — POCT URINALYSIS DIP (MANUAL ENTRY)
BILIRUBIN UA: NEGATIVE
Glucose, UA: NEGATIVE mg/dL
Ketones, POC UA: NEGATIVE mg/dL
NITRITE UA: NEGATIVE
PROTEIN UA: NEGATIVE mg/dL
Spec Grav, UA: 1.02 (ref 1.010–1.025)
UROBILINOGEN UA: 0.2 U/dL
pH, UA: 7 (ref 5.0–8.0)

## 2016-12-31 MED ORDER — PHENAZOPYRIDINE HCL 200 MG PO TABS: 200.0000 mg | ORAL_TABLET | Freq: Three times a day (TID) | ORAL | 0 refills | Status: DC | PRN

## 2016-12-31 MED ORDER — NITROFURANTOIN MONOHYD MACRO 100 MG PO CAPS: 100.0000 mg | ORAL_CAPSULE | Freq: Two times a day (BID) | ORAL | 0 refills | Status: AC

## 2016-12-31 NOTE — Patient Instructions (Addendum)
  Your results indicate you have a UTI. I have given you a prescription for an antibiotic. Please take with food. I have sent off a urine culture and we should have those results in 48 hours. If your symptoms worsen while you are awaiting these results or you develop fever, chills, flank pian, nausea and vomiting, please seek care immediately.   In terms of the mass on the labia, this most consistent with a cyst, which is normal to come and go.   If it causes you any discomfort, you can apply warm compress to the affected area.  If it becomes inflamed, red, tender, or you develop any systemic signs like fever, chills please seek care immediately.   Urinary Tract Infection, Adult A urinary tract infection (UTI) is an infection of any part of the urinary tract. The urinary tract includes the:  Kidneys.  Ureters.  Bladder.  Urethra.  These organs make, store, and get rid of pee (urine) in the body. Follow these instructions at home:  Take over-the-counter and prescription medicines only as told by your doctor.  If you were prescribed an antibiotic medicine, take it as told by your doctor. Do not stop taking the antibiotic even if you start to feel better.  Avoid the following drinks: ? Alcohol. ? Caffeine. ? Tea. ? Carbonated drinks.  Drink enough fluid to keep your pee clear or pale yellow.  Keep all follow-up visits as told by your doctor. This is important.  Make sure to: ? Empty your bladder often and completely. Do not to hold pee for long periods of time. ? Empty your bladder before and after sex. ? Wipe from front to back after a bowel movement if you are female. Use each tissue one time when you wipe. Contact a doctor if:  You have back pain.  You have a fever.  You feel sick to your stomach (nauseous).  You throw up (vomit).  Your symptoms do not get better after 3 days.  Your symptoms go away and then come back. Get help right away if:  You have very bad  back pain.  You have very bad lower belly (abdominal) pain.  You are throwing up and cannot keep down any medicines or water. This information is not intended to replace advice given to you by your health care provider. Make sure you discuss any questions you have with your health care provider. Document Released: 07/10/2007 Document Revised: 06/29/2015 Document Reviewed: 12/12/2014 Elsevier Interactive Patient Education  2018 [employer].   IF you received an x-ray today, you will receive an invoice from [facility]. Please contact [facility] at [phone] with questions or concerns regarding your invoice.   IF you received labwork today, you will receive an invoice from [place]. Please contact [facility] at [phone] with questions or concerns regarding your invoice.   Our billing staff will not be able to assist you with questions regarding bills from these companies.  You will be contacted with the lab results as soon as they are available. The fastest way to get your results is to activate your My Chart account. Instructions are located on the last page of this paperwork. If you have not heard from [place] regarding the results in 2 weeks, please contact this office.

## 2016-12-31 NOTE — Progress Notes (Signed)
12/31/2016 at 12:36 PM  [facility] / DOB: Jul 05, 1970 / MRN: [id number]  The patient has DYSPEPSIA&OTHER SPEC DISORDERS FUNCTION STOMACH; Fibroid, uterine; and Fibroids on their problem list.  Barnard Martin Lagos is a 46 y.o. female who complains of vaginal pimple x 2 days. Has associated discomfort. Notes these pimples do appear regularly and typically resolve after 5 days. She typically applies warm compresses. Notes she does shave that region, but not in that area.   Has associated suprapubic pressure, dysuria, and urinary frequency. Also has a fishy vaginal odor x a while.  She denies hematuria, flank pain and genital rash. Has tried cranberry juice with no relief. Most recent UTI prior to this was 2017. LMP was 12/25/16. Notes she just got off of it and is still having a little spotting. She is sexually active with monogamous partner.    She  has a past medical history of Hyperlipidemia, Rosacea, and Uterine fibroid.    Medications reviewed and updated by myself where necessary, and exist elsewhere in the encounter.   Ms. Ezequiel Kayser [place] is allergic to sulfa antibiotics. She  reports that  has never smoked. she has never used smokeless tobacco. She reports that she drinks about 0.6 oz of alcohol per week. She reports that she does not use drugs. She  reports that she currently engages in sexual activity. The patient  has a past surgical history that includes Breast surgery; Fracture surgery; Uterine fibroid embolization; ir generic historical (05/04/2014); and ir generic historical (03/07/2015).  Her family history includes Hypertension in her mother; Hypothyroidism in her sister.  Review of Systems  Constitutional: Negative for chills and fever.  Gastrointestinal: Negative for nausea and vomiting.    OBJECTIVE  Her  height is 5\' 1"  (1.549 m) and weight is 132 lb 9.6 oz (60.1 kg). Her oral temperature is 98 F (36.7 C). Her blood pressure is 118/76 and her  pulse is 91. Her respiration is 18 and oxygen saturation is 100%.  The patient's body mass index is 25.05 kg/m.  Physical Exam  Constitutional: She is oriented to person, place, and time. She appears well-developed and well-nourished.  HENT:  Head: Normocephalic and atraumatic.  Eyes: Conjunctivae are normal.  Neck: Normal range of motion.  Pulmonary/Chest: Effort normal.  Abdominal: Soft. Normal appearance, normal aorta and bowel sounds are normal. There is tenderness (mild) in the suprapubic area.  Genitourinary: Uterus normal. There is no rash on the right labia. There is lesion (small ~0.25-0.5 cm freely mobile mass palpated within labia minora, no tenderness with paplpation, no overlying erythema, warmth, or fluctaunce noted ) on the left labia. There is no rash on the left labia. Cervix exhibits no motion tenderness and no discharge. Right adnexum displays no mass and no tenderness. Left adnexum displays no mass and no tenderness. No tenderness in the vagina. Vaginal discharge ( brownish yellow discharge noted ) found.  Neurological: She is alert and oriented to person, place, and time.  Skin: Skin is warm and dry.  Psychiatric: She has a normal mood and affect.  Vitals reviewed.   Results for orders placed or performed in visit on 12/31/16 (from the past 24 hour(s))  POCT urinalysis dipstick     Status: Abnormal   Collection Time: 12/31/16 10:02 AM  Result Value Ref Range   Color, UA yellow yellow   Clarity, UA cloudy (A) clear   Glucose, UA negative negative mg/dL   Bilirubin, UA negative negative  Ketones, POC UA negative negative mg/dL   Spec Grav, UA 1.020 1.010 - 1.025   Blood, UA small (A) negative   pH, UA 7.0 5.0 - 8.0   Protein Ur, POC negative negative mg/dL   Urobilinogen, UA 0.2 0.2 or 1.0 E.U./dL   Nitrite, UA Negative Negative   Leukocytes, UA Trace (A) Negative  POCT Wet + KOH Prep     Status: Abnormal   Collection Time: 12/31/16 10:57 AM  Result Value Ref  Range   Yeast by KOH Absent Absent   Yeast by wet prep Absent Absent   WBC by wet prep None (A) Few   Clue Cells Wet Prep HPF POC None None   Trich by wet prep Absent Absent   Bacteria Wet Prep HPF POC Many (A) Few   Epithelial Cells By [employer] Pref ([facility]) Moderate (A) None, Few, Too numerous to count   RBC,UR,HPF,POC None None RBC/hpf  POCT Microscopic Urinalysis ([facility])     Status: Abnormal   Collection Time: 12/31/16 11:03 AM  Result Value Ref Range   WBC,UR,HPF,POC Few (A) None WBC/hpf   RBC,UR,HPF,POC Few (A) None RBC/hpf   Bacteria Many (A) None, Too numerous to count   Mucus Absent Absent   Epithelial Cells, UR Per Microscopy Few (A) None, Too numerous to count cells/hpf    ASSESSMENT & PLAN  Shiann was seen today for dysuria, mass, urinary frequency and abdominal pain.  Diagnoses and all orders for this visit:  Dysuria -     POCT urinalysis dipstick -     POCT Microscopic Urinalysis ([facility]) -     phenazopyridine (PYRIDIUM) 200 MG tablet; Take 1 tablet (200 mg total) by mouth 3 (three) times daily as needed.  Suprapubic pressure  Urinary frequency  Vaginal odor -     POCT Wet + KOH Prep  Leukocytes in urine Due to symptoms, UA ,and urine micro findings, will treat empirically for UTI at this time.  Urine culture pending. -     Urine Culture -     nitrofurantoin, macrocrystal-monohydrate, (MACROBID) 100 MG capsule; Take 1 capsule (100 mg total) by mouth 2 (two) times daily for 5 days.  Vulvar mass -History and physical exam findings consistent with benign cyst.  No signs of infection on physical exam. Encouraged to continue with warm compresses if that helps.  Encouraged patient to return if she develops any overlying redness, worsening pain, purulent discharge, or new concerning symptoms.    The patient was advised to call or come back to clinic if she does not see an improvement in symptoms, or worsens with the above plan.   Tenna Delaine, PA-C  Primary Care at  [facility] 12/31/2016 12:39 PM

## 2017-01-03 LAB — URINE CULTURE

## 2017-06-02 ENCOUNTER — Encounter: Payer: Self-pay | Admitting: Physician Assistant

## 2017-06-02 ENCOUNTER — Ambulatory Visit: Payer: Self-pay

## 2017-06-02 ENCOUNTER — Other Ambulatory Visit: Payer: Self-pay

## 2017-06-02 ENCOUNTER — Ambulatory Visit: Payer: BC Managed Care – PPO | Admitting: Physician Assistant

## 2017-06-02 VITALS — BP 108/72 | HR 90 | Temp 99.2°F | Resp 18 | Ht 61.93 in | Wt 125.8 lb

## 2017-06-02 DIAGNOSIS — R21 Rash and other nonspecific skin eruption: Secondary | ICD-10-CM | POA: Diagnosis not present

## 2017-06-02 DIAGNOSIS — J309 Allergic rhinitis, unspecified: Secondary | ICD-10-CM

## 2017-06-02 DIAGNOSIS — N926 Irregular menstruation, unspecified: Secondary | ICD-10-CM | POA: Diagnosis not present

## 2017-06-02 LAB — POCT URINE PREGNANCY: PREG TEST UR: NEGATIVE

## 2017-06-02 MED ORDER — TRIAMCINOLONE ACETONIDE 0.1 % EX CREA
1.0000 | TOPICAL_CREAM | Freq: Two times a day (BID) | CUTANEOUS | 0 refills | Status: DC
Start: 2017-06-02 — End: 2020-07-12

## 2017-06-02 MED ORDER — CETIRIZINE HCL 10 MG PO TABS: 10.0000 mg | ORAL_TABLET | Freq: Every day | ORAL | 0 refills | Status: DC

## 2017-06-02 NOTE — Progress Notes (Signed)
[place]  MRN: [id number] DOB: 26-Jan-1971  Subjective:   Ariana Edwards is a 47 y.o. female who presents for evaluation of a rash involving the chest. Rash started 1 day ago. Lesions are flat and red. Were raised in texture but improved.   Rash is pruritic. Associated symptoms: none. Patient denies: abdominal pain, arthralgia, congestion, crankiness, decrease in appetite, decrease in energy level, fever, headache, irritability, myalgia, nausea, sore throat and vomiting. Patient has not had contacts with similar rash. Patient has had new exposure to lotion, which she used only on the affected area the day prior. When the rash erupted it was very pruritic. She used OTC hydrocortisone cream with some relief. No new exposure to soaps, laundry detergents, foods, medications, plants, insects or animals. LMP 03/26/17. Has appointment with gynecology tomorrow.    Review of Systems  Constitutional: Negative for chills, diaphoresis and fever.  HENT: Positive for sneezing. Negative for postnasal drip, sinus pressure, sinus pain and voice change.   Respiratory: Positive for cough ( dry cough, thinks it is related to seasonal allergies, not taking anything). Negative for shortness of breath and stridor.   Allergic/Immunologic: Positive for environmental allergies.    Patient Active Problem List   Diagnosis Date Noted  . Fibroids 07/20/2014  . Fibroid, uterine   . DYSPEPSIA&OTHER [facility] DISORDERS FUNCTION STOMACH 06/23/2008    Current Outpatient Medications on File Prior to Visit  Medication Sig Dispense Refill  . cyclobenzaprine (FLEXERIL) 5 MG tablet Take 1 tablet at night as needed for back spasm. (Patient not taking: Reported on 12/31/2016) 30 tablet 1  . phenazopyridine (PYRIDIUM) 200 MG tablet Take 1 tablet (200 mg total) by mouth 3 (three) times daily as needed. (Patient not taking: Reported on 06/02/2017) 6 tablet 0   No current facility-administered medications on file  prior to visit.     Allergies  Allergen Reactions  . Sulfa Antibiotics Rash     Objective:  BP 108/72 (BP Location: Left Arm, Patient Position: Sitting, Cuff Size: Normal)   Pulse 90   Temp 99.2 F (37.3 C) (Oral)   Resp 18   Ht 5' 1.93" (1.573 m)   Wt 125 lb 12.8 oz (57.1 kg)   LMP 03/26/2017 (Approximate)   SpO2 100%   BMI 23.06 kg/m   Physical Exam  Constitutional: She is oriented to person, place, and time. She appears well-developed and well-nourished.  HENT:  Head: Normocephalic and atraumatic.  Nose: Mucosal edema (moderate bilaterally) present.  Eyes: Conjunctivae are normal.  Neck: Normal range of motion.  Pulmonary/Chest: Effort normal and breath sounds normal. She has no wheezes. She has no rhonchi. She has no rales.  Neurological: She is alert and oriented to person, place, and time.  Skin: Skin is warm and dry. Rash (small affected area of anteior chest with mildly erythematous maculopapular rash) noted.  Psychiatric: She has a normal mood and affect.  Vitals reviewed.    Results for orders placed or performed in visit on 06/02/17 (from the past 24 hour(s))  POCT urine pregnancy     Status: None   Collection Time: 06/02/17  5:15 PM  Result Value Ref Range   Preg Test, Ur Negative Negative     Assessment and Plan :  1. Rash and nonspecific skin eruption Hx and PE findings consistent with allergic contact dermatitis. It has improved with hydrocortisone cream but no full improvement. Will give Rx for triamcinolone to use at this time and zyrtec. Avoid irritating lotion.  Advised to return to clinic if symptoms worsen, do not improve, or as needed.  - triamcinolone cream (KENALOG) 0.1 %; Apply 1 application topically 2 (two) times daily.  Dispense: 30 g; Refill: 0 - cetirizine (ZYRTEC) 10 MG tablet; Take 1 tablet (10 mg total) by mouth daily.  Dispense: 30 tablet; Refill: 0  2. Allergic rhinitis, unspecified seasonality, unspecified trigger - cetirizine  (ZYRTEC) 10 MG tablet; Take 1 tablet (10 mg total) by mouth daily.  Dispense: 30 tablet; Refill: 0  3. Missed period POC preg test negative. Follow up with gyn.  - POCT urine pregnancy    Tenna Delaine PA-C  Primary Care at [place] 06/02/2017 5:21 PM

## 2017-06-02 NOTE — Telephone Encounter (Signed)
Pt. Reports she developed a rash after applying lotion to her neck yesterday. States "It looks like a sunburn, but has red spots too. Very itchy." Appointment made for today.  Reason for Disposition . [1] Looks infected (spreading redness, pus) AND [2] no fever  Answer Assessment - Initial Assessment Questions 1. APPEARANCE of RASH: "Describe the rash."      Red spots - flat 2. LOCATION: "Where is the rash located?"      Neck 3. NUMBER: "How many spots are there?"      Numerous 4. SIZE: "How big are the spots?" (Inches, centimeters or compare to size of a coin)      Large "like a sunburn" 5. ONSET: "When did the rash start?"      Yesterday 6. ITCHING: "Does the rash itch?" If so, ask: "How bad is the itch?"  (Scale 1-10; or mild, moderate, severe)     7 7. PAIN: "Does the rash hurt?" If so, ask: "How bad is the pain?"  (Scale 1-10; or mild, moderate, severe)     2 8. OTHER SYMPTOMS: "Do you have any other symptoms?" (e.g., fever)     No 9. PREGNANCY: "Is there any chance you are pregnant?" "When was your last menstrual period?"     Unsure  Protocols used: RASH OR REDNESS - LOCALIZED-A-AH

## 2017-06-02 NOTE — Patient Instructions (Addendum)
Apply cream to affected area twice daily.  you may use Zyrtec for itching.  This should also help with allergies.  Please avoid the cream that you used which caused this.  Contact Dermatitis Dermatitis is redness, soreness, and swelling (inflammation) of the skin. Contact dermatitis is a reaction to certain substances that touch the skin. You either touched something that irritated your skin, or you have allergies to something you touched. Follow these instructions at home: [place] your skin as needed.  Apply cool compresses to the affected areas.  Try taking a bath with: ? Epsom salts. Follow the instructions on the package. You can get these at a pharmacy or grocery store. ? Baking soda. Pour a small amount into the bath as told by your doctor. ? Colloidal oatmeal. Follow the instructions on the package. You can get this at a pharmacy or grocery store.  Try applying baking soda paste to your skin. Stir water into baking soda until it looks like paste.  Do not scratch your skin.  Bathe less often.  Bathe in lukewarm water. Avoid using hot water. Medicines  Take or apply over-the-counter and prescription medicines only as told by your doctor.  If you were prescribed an antibiotic medicine, take or apply your antibiotic as told by your doctor. Do not stop taking the antibiotic even if your condition starts to get better. General instructions  Keep all follow-up visits as told by your doctor. This is important.  Avoid the substance that caused your reaction. If you do not know what caused it, keep a journal to try to track what caused it. Write down: ? What you eat. ? What cosmetic products you use. ? What you drink. ? What you wear in the affected area. This includes jewelry.  If you were given a bandage (dressing), take care of it as told by your doctor. This includes when to change and remove it. Contact a doctor if:  You do not get better with  treatment.  Your condition gets worse.  You have signs of infection such as: ? Swelling. ? Tenderness. ? Redness. ? Soreness. ? Warmth.  You have a fever.  You have new symptoms. Get help right away if:  You have a very bad headache.  You have neck pain.  Your neck is stiff.  You throw up (vomit).  You feel very sleepy.  You see red streaks coming from the affected area.  Your bone or joint underneath the affected area becomes painful after the skin has healed.  The affected area turns darker.  You have trouble breathing. This information is not intended to replace advice given to you by your health care provider. Make sure you discuss any questions you have with your health care provider. Document Released: 11/18/2008 Document Revised: 06/29/2015 Document Reviewed: 06/08/2014 Elsevier Interactive Patient Education  2018 [employer].   IF you received an x-ray today, you will receive an invoice from [facility]. Please contact [facility] at [phone] with questions or concerns regarding your invoice.   IF you received labwork today, you will receive an invoice from [place]. Please contact [facility] at [phone] with questions or concerns regarding your invoice.   Our billing staff will not be able to assist you with questions regarding bills from these companies.  You will be contacted with the lab results as soon as they are available. The fastest way to get your results is to activate your My Chart account. Instructions are located on the  last page of this paperwork. If you have not heard from [place] regarding the results in 2 weeks, please contact this office.

## 2017-07-29 ENCOUNTER — Other Ambulatory Visit: Payer: Self-pay | Admitting: Physician Assistant

## 2017-07-29 DIAGNOSIS — R21 Rash and other nonspecific skin eruption: Secondary | ICD-10-CM

## 2017-07-29 DIAGNOSIS — J309 Allergic rhinitis, unspecified: Secondary | ICD-10-CM

## 2018-06-08 ENCOUNTER — Ambulatory Visit: Payer: Self-pay | Admitting: Physician Assistant

## 2018-06-08 NOTE — Telephone Encounter (Signed)
Pt. Reports she doing yoga 2-3 days ago, and after that noticed chest pain above her left breast. Pain is constant and does not radiate. No shortness of breath, dizziness or sweating. Warm transfer to [place] in the practice for a virtual visit.  Answer Assessment - Initial Assessment Questions 1. LOCATION: "Where does it hurt?"       Left sided over breast 2. RADIATION: "Does the pain go anywhere else?" (e.g., into neck, jaw, arms, back)     No 3. ONSET: "When did the chest pain begin?" (Minutes, hours or days)      2-3 days ago 4. PATTERN "Does the pain come and go, or has it been constant since it started?"  "Does it get worse with exertion?"      Constant 5. DURATION: "How long does it last" (e.g., seconds, minutes, hours)     Constant 6. SEVERITY: "How bad is the pain?"  (e.g., Scale 1-10; mild, moderate, or severe)    - MILD (1-3): doesn't interfere with normal activities     - MODERATE (4-7): interferes with normal activities or awakens from sleep    - SEVERE (8-10): excruciating pain, unable to do any normal activities         5 7. CARDIAC RISK FACTORS: "Do you have any history of heart problems or risk factors for heart disease?" (e.g., prior heart attack, angina; high blood pressure, diabetes, being overweight, high cholesterol, smoking, or strong family history of heart disease)     No 8. PULMONARY RISK FACTORS: "Do you have any history of lung disease?"  (e.g., blood clots in lung, asthma, emphysema, birth control pills)     No 9. CAUSE: "What do you think is causing the chest pain?"     Unsure 10. OTHER SYMPTOMS: "Do you have any other symptoms?" (e.g., dizziness, nausea, vomiting, sweating, fever, difficulty breathing, cough)       No 11. PREGNANCY: "Is there any chance you are pregnant?" "When was your last menstrual period?"       No  Protocols used: CHEST PAIN-A-AH

## 2018-06-10 ENCOUNTER — Other Ambulatory Visit: Payer: Self-pay

## 2018-06-10 ENCOUNTER — Telehealth (INDEPENDENT_AMBULATORY_CARE_PROVIDER_SITE_OTHER): Payer: BC Managed Care – PPO | Admitting: Emergency Medicine

## 2018-06-10 ENCOUNTER — Encounter: Payer: Self-pay | Admitting: Emergency Medicine

## 2018-06-10 DIAGNOSIS — Z9189 Other specified personal risk factors, not elsewhere classified: Secondary | ICD-10-CM

## 2018-06-10 DIAGNOSIS — R079 Chest pain, unspecified: Secondary | ICD-10-CM

## 2018-06-10 NOTE — Progress Notes (Signed)
Telemedicine Encounter- SOAP NOTE Established Patient  This videotelephone encounter was conducted with the patient's (or proxy's) verbal consent via audio telecommunications: yes/no: Yes Patient was instructed to have this encounter in a suitably private space; and to only have persons present to whom they give permission to participate. In addition, patient identity was confirmed by use of name plus two identifiers (DOB and address).  I discussed the limitations, risks, security and privacy concerns of performing an evaluation and management service by telephone and the availability of in person appointments. I also discussed with the patient that there may be a patient responsible charge related to this service. The patient expressed understanding and agreed to proceed.  I spent a total of TIME; 0 MIN TO 60 MIN: 15 minutes talking with the patient or their proxy.  No chief complaint on file. Left-sided chest pain  Subjective   Ariana Edwards Nipple is a 48 y.o. female established patient.  First visit with me.  Telephone visit today for evaluation of left-sided chest pain that started shortly after having lunch last Saturday, 4 days ago.  Patient has no cardiac history or pulmonary conditions.  Patient is physically active with no history of chest pain on exertion.  Pain has been sharp and constant, worse with deep breathing or coughing.  Patient has been exercising and doing yoga with stretching prior to symptom development.  Denies associated symptoms such as difficulty breathing, coughing, fever or chills, diaphoresis, nausea or vomiting, syncope.  Has been able to continue regular daily activities since onset of pain.  Patient is a non-smoker with no significant family history of cardiac conditions.  Premenopausal.  No history of DVT or blood clot problems.  Denies fever or flulike symptoms.  Denies breast pain.  Area is tender to palpation.  No other complaints or medical concerns today.   HPI   Patient Active Problem List   Diagnosis Date Noted  . Fibroids 07/20/2014  . Fibroid, uterine   . DYSPEPSIA&OTHER [facility] DISORDERS FUNCTION STOMACH 06/23/2008    Past Medical History:  Diagnosis Date  . Hyperlipidemia   . Rosacea   . Uterine fibroid     Current Outpatient Medications  Medication Sig Dispense Refill  . Vitamin D, Ergocalciferol, (DRISDOL) 1.25 MG (50000 UT) CAPS capsule Take 50,000 Units by mouth every 7 (seven) days.    . cetirizine (ZYRTEC) 10 MG tablet TAKE 1 TABLET BY MOUTH EVERY DAY (Patient not taking: Reported on 06/10/2018) 30 tablet 0  . cyclobenzaprine (FLEXERIL) 5 MG tablet Take 1 tablet at night as needed for back spasm. (Patient not taking: Reported on 06/10/2018) 30 tablet 1  . phenazopyridine (PYRIDIUM) 200 MG tablet Take 1 tablet (200 mg total) by mouth 3 (three) times daily as needed. (Patient not taking: Reported on 06/10/2018) 6 tablet 0  . triamcinolone cream (KENALOG) 0.1 % Apply 1 application topically 2 (two) times daily. (Patient not taking: Reported on 06/10/2018) 30 g 0   No current facility-administered medications for this visit.     Allergies  Allergen Reactions  . Sulfa Antibiotics Rash    Social History   Socioeconomic History  . Marital status: Married    Spouse name: Not on file  . Number of children: 0  . Years of education: Not on file  . Highest education level: Not on file  Occupational History  . Not on file  Social Needs  . Financial resource strain: Not on file  . Food insecurity:    Worry:  Not on file    Inability: Not on file  . Transportation needs:    Medical: Not on file    Non-medical: Not on file  Tobacco Use  . Smoking status: Never Smoker  . Smokeless tobacco: Never Used  Substance and Sexual Activity  . Alcohol use: Yes    Alcohol/week: 1.0 standard drinks    Types: 1 Standard drinks or equivalent per week  . Drug use: No  . Sexual activity: Yes    Comment: no birth control  Lifestyle  .  Physical activity:    Days per week: Not on file    Minutes per session: Not on file  . Stress: Not on file  Relationships  . Social connections:    Talks on phone: Not on file    Gets together: Not on file    Attends religious service: Not on file    Active member of club or organization: Not on file    Attends meetings of clubs or organizations: Not on file    Relationship status: Not on file  . Intimate partner violence:    Fear of current or ex partner: Not on file    Emotionally abused: Not on file    Physically abused: Not on file    Forced sexual activity: Not on file  Other Topics Concern  . Not on file  Social History Narrative  . Not on file    Review of Systems  Constitutional: Negative.  Negative for chills and fever.  HENT: Negative for congestion and sore throat.   Eyes: Negative for blurred vision and double vision.  Respiratory: Negative.  Negative for cough, hemoptysis and shortness of breath.   Cardiovascular: Positive for chest pain. Negative for palpitations, claudication and leg swelling.  Gastrointestinal: Negative.  Negative for abdominal pain, diarrhea, nausea and vomiting.  Genitourinary: Negative for dysuria.  Skin: Negative.  Negative for rash.  Neurological: Negative for dizziness and headaches.  Endo/Heme/Allergies: Negative.   All other systems reviewed and are negative.   Objective   Vitals as reported by the patient: None available There were no vitals filed for this visit.  Physical Exam  Constitutional: She is oriented to person, place, and time and well-developed, well-nourished, and in no distress.  HENT:  Head: Normocephalic.  Eyes: EOM are normal.  Neck: Normal range of motion.  Pulmonary/Chest: Effort normal.  Neurological: She is alert and oriented to person, place, and time.  Psychiatric: Affect normal.    There are no diagnoses linked to this encounter. Diagnoses and all orders for this visit:  Acute nonspecific chest  pain with low risk of coronary artery disease  Clinically stable.  No red flag signs or symptoms.  Low risk of coronary artery disease.  Differential diagnosis discussed with patient. Advised to take Aleve twice a day for 5 days.  Monitor symptoms and call the office if worse.  ED precautions given. Office visit scheduled for next Monday.   I discussed the assessment and treatment plan with the patient. The patient was provided an opportunity to ask questions and all were answered. The patient agreed with the plan and demonstrated an understanding of the instructions.   The patient was advised to call back or seek an in-person evaluation if the symptoms worsen or if the condition fails to improve as anticipated.  I provided 15 minutes of non-face-to-face time during this encounter.  Horald Pollen, MD  Primary Care at [facility]

## 2018-06-10 NOTE — Progress Notes (Signed)
Contacted patient to triage for appointment. Patient is complaining of chest pain on the left side since Saturday 05/07/2018. Patient states when she takes a deep breath it is more pain. Patient states is coughing and when she stretches, sneezing or reaching up more pain. Patient does not have shortness of breath. Also, patient sleeps on the opposite side because of the pain.

## 2018-06-15 ENCOUNTER — Ambulatory Visit: Payer: BC Managed Care – PPO | Admitting: Emergency Medicine

## 2019-04-10 ENCOUNTER — Ambulatory Visit: Payer: BC Managed Care – PPO | Attending: Internal Medicine

## 2019-04-10 DIAGNOSIS — Z23 Encounter for immunization: Secondary | ICD-10-CM | POA: Insufficient documentation

## 2019-04-10 NOTE — Progress Notes (Signed)
   Covid-19 Vaccination Clinic  Name:  Ariana Edwards    MRN: [id number] DOB: [birth year]  04/10/2019  Ariana Edwards was observed post Covid-19 immunization for 15 minutes without incident. She was provided with Vaccine Information Sheet and instruction to access the V-Safe system.   Ariana Edwards was instructed to call 911 with any severe reactions post vaccine: Marland Kitchen Difficulty breathing  . Swelling of face and throat  . A fast heartbeat  . A bad rash all over body  . Dizziness and weakness   Immunizations Administered    Name Date Dose VIS Date Route   Pfizer COVID-19 Vaccine 04/10/2019 12:52 PM 0.3 mL 01/15/2019 Intramuscular   Manufacturer: [place]   Lot: [id number]   [place]: [id number]

## 2019-05-01 ENCOUNTER — Ambulatory Visit: Payer: BC Managed Care – PPO | Attending: Internal Medicine

## 2019-05-01 DIAGNOSIS — Z23 Encounter for immunization: Secondary | ICD-10-CM

## 2019-05-01 NOTE — Progress Notes (Signed)
   Covid-19 Vaccination Clinic  Name:  Zeyda Hamed    MRN: [id number] DOB: [birth year]  05/01/2019  Ms. Keeler Farm was observed post Covid-19 immunization for 15 minutes without incident. She was provided with Vaccine Information Sheet and instruction to access the V-Safe system.   Ms. Ezequiel Kayser Caliente was instructed to call 911 with any severe reactions post vaccine: Marland Kitchen Difficulty breathing  . Swelling of face and throat  . A fast heartbeat  . A bad rash all over body  . Dizziness and weakness   Immunizations Administered    Name Date Dose VIS Date Route   Pfizer COVID-19 Vaccine 05/01/2019 12:57 PM 0.3 mL 01/15/2019 Intramuscular   Manufacturer: [place]   Lot: [id number]   [place]: [id number]

## 2019-06-09 ENCOUNTER — Other Ambulatory Visit: Payer: Self-pay

## 2019-06-09 ENCOUNTER — Ambulatory Visit: Payer: BC Managed Care – PPO | Admitting: Emergency Medicine

## 2019-06-09 ENCOUNTER — Encounter: Payer: Self-pay | Admitting: Emergency Medicine

## 2019-06-09 VITALS — BP 109/73 | HR 88 | Temp 98.4°F | Ht 62.0 in | Wt 125.6 lb

## 2019-06-09 DIAGNOSIS — Z8616 Personal history of COVID-19: Secondary | ICD-10-CM

## 2019-06-09 DIAGNOSIS — R430 Anosmia: Secondary | ICD-10-CM

## 2019-06-09 LAB — CBC WITH DIFFERENTIAL/PLATELET
Basophils Absolute: 0.1 10*3/uL (ref 0.0–0.2)
Basos: 1 %
EOS (ABSOLUTE): 0.5 10*3/uL — ABNORMAL HIGH (ref 0.0–0.4)
Eos: 6 %
Hematocrit: 42.5 % (ref 34.0–46.6)
Hemoglobin: 13.8 g/dL (ref 11.1–15.9)
Immature Grans (Abs): 0 10*3/uL (ref 0.0–0.1)
Immature Granulocytes: 0 %
Lymphocytes Absolute: 2.8 10*3/uL (ref 0.7–3.1)
Lymphs: 32 %
MCH: 30 pg (ref 26.6–33.0)
MCHC: 32.5 g/dL (ref 31.5–35.7)
MCV: 92 fL (ref 79–97)
Monocytes Absolute: 0.5 10*3/uL (ref 0.1–0.9)
Monocytes: 6 %
Neutrophils Absolute: 4.7 10*3/uL (ref 1.4–7.0)
Neutrophils: 55 %
Platelets: 329 10*3/uL (ref 150–450)
RBC: 4.6 x10E6/uL (ref 3.77–5.28)
RDW: 12.9 % (ref 11.7–15.4)
WBC: 8.7 10*3/uL (ref 3.4–10.8)

## 2019-06-09 NOTE — Patient Instructions (Addendum)
   If you have lab work done today you will be contacted with your lab results within the next 2 weeks.  If you have not heard from us then please contact us. The fastest way to get your results is to register for My Chart.   IF you received an x-ray today, you will receive an invoice from [facility]. Please contact [facility] at [phone] with questions or concerns regarding your invoice.   IF you received labwork today, you will receive an invoice from [facility]. Please contact [facility] at [phone] with questions or concerns regarding your invoice.   Our billing staff will not be able to assist you with questions regarding bills from these companies.  You will be contacted with the lab results as soon as they are available. The fastest way to get your results is to activate your My Chart account. Instructions are located on the last page of this paperwork. If you have not heard from us regarding the results in 2 weeks, please contact this office.     Mantenimiento de la salud en las mujeres Health Maintenance, Female Adoptar un estilo de vida saludable y recibir atencin preventiva son importantes para promover la salud y el bienestar. Consulte al mdico sobre:  El esquema adecuado para hacerse pruebas y exmenes peridicos.  Cosas que puede hacer por su cuenta para prevenir enfermedades y mantenerse sana. Qu debo saber sobre la dieta, el peso y el ejercicio? Consuma una dieta saludable   Consuma una dieta que incluya muchas verduras, frutas, productos lcteos con bajo contenido de grasa y protenas magras.  No consuma muchos alimentos ricos en grasas slidas, azcares agregados o sodio. Mantenga un peso saludable El ndice de masa muscular (IMC) se utiliza para identificar problemas de peso. Proporciona una estimacin de la grasa corporal basndose en el peso y la altura. Su mdico puede ayudarle a determinar su IMC y a lograr o mantener un peso  saludable. Haga ejercicio con regularidad Haga ejercicio con regularidad. Esta es una de las prcticas ms importantes que puede hacer por su salud. La mayora de los adultos deben seguir estas pautas:  Realizar, al menos, 150minutos de actividad fsica por semana. El ejercicio debe aumentar la frecuencia cardaca y hacerlo transpirar (ejercicio de intensidad moderada).  Hacer ejercicios de fortalecimiento por lo menos dos veces por semana. Agregue esto a su plan de ejercicio de intensidad moderada.  Pasar menos tiempo sentados. Incluso la actividad fsica ligera puede ser beneficiosa. Controle sus niveles de colesterol y lpidos en la sangre Comience a realizarse anlisis de lpidos y colesterol en la sangre a los 20aos y luego reptalos cada 5aos. Hgase controlar los niveles de colesterol con mayor frecuencia si:  Sus niveles de lpidos y colesterol son altos.  Es mayor de 40aos.  Presenta un alto riesgo de padecer enfermedades cardacas. Qu debo saber sobre las pruebas de deteccin del cncer? Segn su historia clnica y sus antecedentes familiares, es posible que deba realizarse pruebas de deteccin del cncer en diferentes edades. Esto puede incluir pruebas de deteccin de lo siguiente:  Cncer de mama.  Cncer de cuello uterino.  Cncer colorrectal.  Cncer de piel.  Cncer de pulmn. Qu debo saber sobre la enfermedad cardaca, la diabetes y la hipertensin arterial? Presin arterial y enfermedad cardaca  La hipertensin arterial causa enfermedades cardacas y aumenta el riesgo de accidente cerebrovascular. Es ms probable que esto se manifieste en las personas que tienen lecturas de presin arterial alta, tienen ascendencia africana o   tienen sobrepeso.  Hgase controlar la presin arterial: ? Cada 3 a 5 aos si tiene entre 18 y 39 aos. ? Todos los aos si es mayor de 40aos. Diabetes Realcese exmenes de deteccin de la diabetes con regularidad. Este  anlisis revisa el nivel de azcar en la sangre en ayunas. Hgase las pruebas de deteccin:  Cada tresaos despus de los 40aos de edad si tiene un peso normal y un bajo riesgo de padecer diabetes.  Con ms frecuencia y a partir de una edad inferior si tiene sobrepeso o un alto riesgo de padecer diabetes. Qu debo saber sobre la prevencin de infecciones? Hepatitis B Si tiene un riesgo ms alto de contraer hepatitis B, debe someterse a un examen de deteccin de este virus. Hable con el mdico para averiguar si tiene riesgo de contraer la infeccin por hepatitis B. Hepatitis C Se recomienda el anlisis a:  Todos los que nacieron entre 1945 y 1965.  Todas las personas que tengan un riesgo de haber contrado hepatitis C. Enfermedades de transmisin sexual (ETS)  Hgase las pruebas de deteccin de ITS, incluidas la gonorrea y la clamidia, si: ? Es sexualmente activa y es menor de 24aos. ? Es mayor de 24aos, y el mdico le informa que corre riesgo de tener este tipo de infecciones. ? La actividad sexual ha cambiado desde que le hicieron la ltima prueba de deteccin y tiene un riesgo mayor de tener clamidia o gonorrea. Pregntele al mdico si usted tiene riesgo.  Pregntele al mdico si usted tiene un alto riesgo de contraer VIH. El mdico tambin puede recomendarle un medicamento recetado para ayudar a evitar la infeccin por el VIH. Si elige tomar medicamentos para prevenir el VIH, primero debe hacerse los anlisis de deteccin del VIH. Luego debe hacerse anlisis cada 3meses mientras est tomando los medicamentos. Embarazo  Si est por dejar de menstruar (fase premenopusica) y usted puede quedar embarazada, busque asesoramiento antes de quedar embarazada.  Tome de 400 a 800microgramos (mcg) de cido flico todos los das si queda embarazada.  Pida mtodos de control de la natalidad (anticonceptivos) si desea evitar un embarazo no deseado. Osteoporosis y menopausia La  osteoporosis es una enfermedad en la que los huesos pierden los minerales y la fuerza por el avance de la edad. El resultado pueden ser fracturas en los huesos. Si tiene 65aos o ms, o si est en riesgo de sufrir osteoporosis y fracturas, pregunte a su mdico si debe:  Hacerse pruebas de deteccin de prdida sea.  Tomar un suplemento de calcio o de vitamina D para reducir el riesgo de fracturas.  Recibir terapia de reemplazo hormonal (TRH) para tratar los sntomas de la menopausia. Siga estas instrucciones en su casa: Estilo de vida  No consuma ningn producto que contenga nicotina o tabaco, como cigarrillos, cigarrillos electrnicos y tabaco de mascar. Si necesita ayuda para dejar de fumar, consulte al mdico.  No consuma drogas.  No comparta agujas.  Solicite ayuda a su mdico si necesita apoyo o informacin para abandonar las drogas. Consumo de alcohol  No beba alcohol si: ? Su mdico le indica no hacerlo. ? Est embarazada, puede estar embarazada o est tratando de quedar embarazada.  Si bebe alcohol: ? Limite la cantidad que consume de 0 a 1 medida por da. ? Limite la ingesta si est amamantando.  Est atento a la cantidad de alcohol que hay en las bebidas que toma. En los [place], una medida equivale a una botella de cerveza de 12oz (355ml),   un vaso de vino de 5oz (148ml) o un vaso de una bebida alcohlica de alta graduacin de 1oz (44ml). Instrucciones generales  Realcese los estudios de rutina de la salud, dentales y de la vista.  Mantngase al da con las vacunas.  Infrmele a su mdico si: ? Se siente deprimida con frecuencia. ? Alguna vez ha sido vctima de maltrato o no se siente segura en su casa. Resumen  Adoptar un estilo de vida saludable y recibir atencin preventiva son importantes para promover la salud y el bienestar.  Siga las instrucciones del mdico acerca de una dieta saludable, el ejercicio y la realizacin de pruebas o exmenes  para detectar enfermedades.  Siga las instrucciones del mdico con respecto al control del colesterol y la presin arterial. Esta informacin no tiene como fin reemplazar el consejo del mdico. Asegrese de hacerle al mdico cualquier pregunta que tenga. Document Revised: 02/11/2018 Document Reviewed: 02/11/2018 Elsevier Patient Education  2020 Elsevier Inc.  

## 2019-06-09 NOTE — Progress Notes (Signed)
[place] 49 y.o.   Chief Complaint  Patient presents with  . concerns    Pt stated that she came dw with COVID 02/19/2019 and she has had both vaccines but she is having some concerns with smell bc nothing smells the same anymore First vaccince was 04/09/2019 and the 2nd vacc was 05/01/2019    HISTORY OF PRESENT ILLNESS: This is a 49 y.o. female complaining of loss of smell since Covid infection last January 15.  Received second Covid vaccination on 05/01/2019.  Loss of smell worsened 1 week ago.  No other associated symptoms.  HPI   Prior to Admission medications   Medication Sig Start Date End Date Taking? Authorizing Provider  cetirizine (ZYRTEC) 10 MG tablet TAKE 1 TABLET BY MOUTH EVERY DAY 07/29/17  Yes Timmothy Euler, [place] D, PA-C  cyclobenzaprine (FLEXERIL) 5 MG tablet Take 1 tablet at night as needed for back spasm. 01/11/16  Yes Darlyne Russian, MD  phenazopyridine (PYRIDIUM) 200 MG tablet Take 1 tablet (200 mg total) by mouth 3 (three) times daily as needed. 12/31/16  Yes Timmothy Euler, [place] D, PA-C  triamcinolone cream (KENALOG) 0.1 % Apply 1 application topically 2 (two) times daily. 06/02/17  Yes Timmothy Euler, [place] D, PA-C  Vitamin D, Ergocalciferol, (DRISDOL) 1.25 MG (50000 UT) CAPS capsule Take 50,000 Units by mouth every 7 (seven) days.   Yes [provider]    Allergies  Allergen Reactions  . Sulfa Antibiotics Rash    Patient Active Problem List   Diagnosis Date Noted  . Fibroids 07/20/2014  . Fibroid, uterine   . DYSPEPSIA&OTHER [facility] DISORDERS FUNCTION STOMACH 06/23/2008    Past Medical History:  Diagnosis Date  . Hyperlipidemia   . Rosacea   . Uterine fibroid     Past Surgical History:  Procedure Laterality Date  . BREAST SURGERY    . FRACTURE SURGERY    . IR GENERIC HISTORICAL  05/04/2014   IR RADIOLOGIST EVAL & MGMT 05/04/2014 Marybelle Killings, MD [facility] INTERV RAD  . IR GENERIC HISTORICAL  03/07/2015   IR RADIOLOGIST EVAL & MGMT 03/07/2015  [facility] INTERV RAD  . UTERINE FIBROID EMBOLIZATION      Social History   Socioeconomic History  . Marital status: Married    Spouse name: Not on file  . Number of children: 0  . Years of education: Not on file  . Highest education level: Not on file  Occupational History  . Not on file  Tobacco Use  . Smoking status: Never Smoker  . Smokeless tobacco: Never Used  Substance and Sexual Activity  . Alcohol use: Yes    Alcohol/week: 1.0 standard drinks    Types: 1 Standard drinks or equivalent per week  . Drug use: No  . Sexual activity: Yes    Comment: no birth control  Other Topics Concern  . Not on file  Social History Narrative  . Not on file   Social Determinants of Health   Financial Resource Strain:   . Difficulty of Paying Living Expenses:   Food Insecurity:   . Worried About Charity fundraiser in the Last Year:   . Arboriculturist in the Last Year:   Transportation Needs:   . Film/video editor (Medical):   Marland Kitchen Lack of Transportation (Non-Medical):   Physical Activity:   . Days of Exercise per Week:   . Minutes of Exercise per Session:   Stress:   . Feeling of Stress :   Social Connections:   .  Frequency of Communication with Friends and Family:   . Frequency of Social Gatherings with Friends and Family:   . Attends Religious Services:   . Active Member of Clubs or Organizations:   . Attends Archivist Meetings:   Marland Kitchen Marital Status:   Intimate Partner Violence:   . Fear of Current or Ex-Partner:   . Emotionally Abused:   Marland Kitchen Physically Abused:   . Sexually Abused:     Family History  Problem Relation Age of Onset  . Hypertension Mother   . Hypothyroidism Sister      Review of Systems  Constitutional: Negative.  Negative for chills and fever.  HENT: Negative.  Negative for congestion and sore throat.   Respiratory: Negative.  Negative for cough and shortness of breath.   Cardiovascular: Negative.  Negative for chest pain.    Gastrointestinal: Negative.  Negative for abdominal pain, blood in stool, diarrhea, nausea and vomiting.  Genitourinary: Negative.  Negative for dysuria and hematuria.  Skin: Negative.  Negative for rash.  Neurological: Negative.  Negative for dizziness and headaches.  All other systems reviewed and are negative.  Today's Vitals   06/09/19 1632  BP: 109/73  Pulse: 88  Temp: 98.4 F (36.9 C)  TempSrc: Temporal  SpO2: 98%  Weight: 125 lb 9.6 oz (57 kg)  Height: 5\' 2"  (1.575 m)   Body mass index is 22.97 kg/m.   Physical Exam Vitals reviewed.  Constitutional:      Appearance: Normal appearance.  HENT:     Head: Normocephalic.  Eyes:     Extraocular Movements: Extraocular movements intact.     Pupils: Pupils are equal, round, and reactive to light.  Cardiovascular:     Rate and Rhythm: Normal rate.  Pulmonary:     Effort: Pulmonary effort is normal.  Musculoskeletal:        General: Normal range of motion.     Cervical back: Normal range of motion.  Skin:    General: Skin is warm and dry.  Neurological:     General: No focal deficit present.     Mental Status: She is alert and oriented to person, place, and time.  Psychiatric:        Mood and Affect: Mood normal.        Behavior: Behavior normal.      ASSESSMENT & PLAN: Ariana Edwards was seen today for concerns.  Diagnoses and all orders for this visit:  Loss of smell -     Comprehensive metabolic panel -     Lipid panel -     Hemoglobin A1c -     CBC with Differential/Platelet  History of COVID-19 -     SAR CoV2 Serology (COVID 19)AB(IGG)IA    Patient Instructions       If you have lab work done today you will be contacted with your lab results within the next 2 weeks.  If you have not heard from [place] then please contact us. The fastest way to get your results is to register for My Chart.   IF you received an x-ray today, you will receive an invoice from [facility]. Please contact [facility] at [phone] with questions or concerns regarding your invoice.   IF you received labwork today, you will receive an invoice from [facility]. Please contact [facility] at [phone] with questions or concerns regarding your invoice.   Our billing staff will not be able to assist you with questions regarding bills from these companies.  You will  be contacted with the lab results as soon as they are available. The fastest way to get your results is to activate your My Chart account. Instructions are located on the last page of this paperwork. If you have not heard from [place] regarding the results in 2 weeks, please contact this office.     Mantenimiento de Technical sales engineer en [place] Maintenance, Female Adoptar un estilo de vida saludable y recibir atencin preventiva son importantes para promover la salud y Musician. Consulte al mdico sobre:  El esquema adecuado para hacerse pruebas y exmenes peridicos.  Cosas que puede hacer por su cuenta para prevenir enfermedades y [employer]. Qu debo saber sobre la dieta, el peso y el ejercicio? Consuma una dieta saludable   Consuma una dieta que incluya muchas verduras, frutas, productos lcteos con bajo contenido de [place] y Advertising account planner.  No consuma muchos alimentos ricos en grasas slidas, azcares agregados o sodio. Mantenga un peso saludable El ndice de masa muscular [facility]) se [place] para identificar problemas de [place]. Proporciona una estimacin de la grasa corporal basndose en el peso y la altura. Su mdico puede ayudarle a Radiation protection practitioner [place] y a Scientist, forensic o Theatre manager un peso saludable. Haga ejercicio con regularidad Haga ejercicio con regularidad. Esta es una de las prcticas ms importantes que puede hacer por su salud. La mayora de los adultos deben seguir estas pautas:  Optometrist, al menos, 171minutos de actividad fsica por semana. El ejercicio debe aumentar la frecuencia cardaca y Nature conservation officer transpirar (ejercicio  de intensidad moderada).  Hacer ejercicios de fortalecimiento por lo [employer] por semana. Agregue esto a su plan de ejercicio de intensidad moderada.  Pasar menos tiempo sentados. Incluso la actividad fsica ligera puede ser beneficiosa. Controle sus niveles de colesterol y lpidos en la sangre Comience a realizarse anlisis de lpidos y Research officer, trade union en la sangre a los 20aos y luego reptalos cada 5aos. Hgase controlar los niveles de colesterol con mayor frecuencia si:  Sus niveles de lpidos y colesterol son altos.  Es mayor de 40aos.  Presenta un alto riesgo de padecer enfermedades cardacas. Qu debo saber sobre las pruebas de deteccin del cncer? Segn su historia clnica y sus antecedentes familiares, es posible que deba realizarse pruebas de deteccin del cncer en diferentes edades. Esto puede incluir pruebas de deteccin de lo siguiente:  Cncer de mama.  Cncer de cuello uterino.  Cncer colorrectal.  Cncer de piel.  Cncer de pulmn. Qu debo saber sobre la enfermedad cardaca, la diabetes y la hipertensin arterial? Presin arterial y enfermedad cardaca  La hipertensin arterial causa enfermedades cardacas y [place] el riesgo de accidente cerebrovascular. Es ms probable que esto se manifieste en las personas que tienen lecturas de presin arterial alta, tienen ascendencia africana o tienen sobrepeso.  Hgase controlar la presin arterial: ? Cada 3 a 5 aos si tiene entre 18 y 60 aos. ? Todos los aos si es mayor de [place]. Diabetes Realcese exmenes de deteccin de la diabetes con regularidad. Este anlisis revisa el nivel de azcar en la sangre en [place]. Hgase las pruebas de deteccin:  Cada tresaos despus de los 82aos de edad si tiene un peso normal y un bajo riesgo de padecer diabetes.  Con ms frecuencia y a partir de [place] edad inferior si tiene sobrepeso o un alto riesgo de padecer diabetes. Qu debo saber sobre la prevencin de  infecciones? Hepatitis B Si tiene un riesgo ms alto de contraer hepatitis B, debe someterse a Barista  de deteccin de este virus. Hable con el mdico para averiguar si tiene riesgo de contraer la infeccin por hepatitis B. Hepatitis C Se recomienda el anlisis a:  [employer] 1945 y 1965.  Todas las personas que tengan un riesgo de haber contrado hepatitis C. Enfermedades de transmisin sexual (ETS)  Hgase las pruebas de Programme researcher, broadcasting/film/video de ITS, incluidas la gonorrea y la clamidia, si: ? Es sexualmente activa y es menor de [place]. ? Es mayor de 24aos, y Investment banker, operational informa que corre riesgo de tener este tipo de infecciones. ? La actividad sexual ha cambiado desde que le hicieron la ltima prueba de deteccin y tiene un riesgo mayor de Best boy clamidia o Radio broadcast assistant. Pregntele al mdico si usted tiene riesgo.  Pregntele al mdico si usted tiene un alto riesgo de Museum/gallery curator VIH. El mdico tambin puede recomendarle un medicamento recetado para ayudar a evitar la infeccin por el VIH. Si elige tomar medicamentos para prevenir el VIH, primero debe [employer] de deteccin del VIH. Luego debe hacerse anlisis cada 60meses mientras est tomando los medicamentos. Embarazo  Si est por dejar de Librarian, academic (fase premenopusica) y usted puede quedar [place], busque asesoramiento antes de [place].  Tome de 400 a [id number] (mcg) de cido [employer] si [place].  Pida mtodos de control de la natalidad (anticonceptivos) si desea evitar un embarazo no deseado. Osteoporosis y [place] La osteoporosis es una enfermedad en la que los huesos pierden los minerales y la fuerza por el avance de la edad. El resultado pueden ser fracturas en los [place]. Si tiene 65aos o ms, o si est en riesgo de sufrir osteoporosis y fracturas, pregunte a su mdico si debe:  Hacerse pruebas de deteccin de prdida sea.  Tomar un suplemento de calcio o de  vitamina D para reducir el riesgo de fracturas.  Recibir terapia de reemplazo hormonal (TRH) para tratar los sntomas de la menopausia. Siga estas instrucciones en su casa: Estilo de vida  No consuma ningn producto que contenga nicotina o tabaco, como cigarrillos, cigarrillos electrnicos y tabaco de Higher education careers adviser. Si necesita ayuda para dejar de fumar, consulte al mdico.  No consuma drogas.  No comparta agujas.  Solicite ayuda a su mdico si necesita apoyo o informacin para abandonar las drogas. Consumo de alcohol  No beba alcohol si: ? Su mdico le indica no hacerlo. ? Est embarazada, puede estar embarazada o est tratando de quedar embarazada.  Si bebe alcohol: ? Limite la cantidad que consume de 0 a 1 medida por da. ? Limite la ingesta si est amamantando.  Est atento a la cantidad de alcohol que hay en las bebidas que toma. En los [place], una medida equivale a una botella de cerveza de 12oz (332ml), un vaso de vino de 5oz (113ml) o un vaso de una bebida alcohlica de alta graduacin de 1oz (92ml). Instrucciones generales  Realcese los estudios de rutina de la salud, dentales y de Public librarian.  [place].  Infrmele a su mdico si: ? Se siente deprimida con frecuencia. ? Alguna vez ha sido vctima de [place] o no se siente segura en su casa. Resumen  Adoptar un estilo de vida saludable y recibir atencin preventiva son importantes para promover la salud y Musician.  Siga las instrucciones del mdico acerca de una dieta saludable, el ejercicio y la realizacin de pruebas o exmenes para Engineer, building services.  Siga las instrucciones del mdico con respecto al control  del colesterol y la presin arterial. Esta informacin no tiene Marine scientist el consejo del mdico. Asegrese de hacerle al mdico cualquier pregunta que tenga. Document Revised: 02/11/2018 Document Reviewed: 02/11/2018 Elsevier Patient Education  2020 Elsevier  Inc.      Ariana Caroli, MD [facility]

## 2019-06-10 ENCOUNTER — Encounter: Payer: Self-pay | Admitting: Emergency Medicine

## 2019-06-10 LAB — COMPREHENSIVE METABOLIC PANEL
ALT: 9 IU/L (ref 0–32)
AST: 14 IU/L (ref 0–40)
Albumin/Globulin Ratio: 2.4 — ABNORMAL HIGH (ref 1.2–2.2)
Albumin: 4.3 g/dL (ref 3.8–4.8)
Alkaline Phosphatase: 62 IU/L (ref 39–117)
BUN/Creatinine Ratio: 19 (ref 9–23)
BUN: 16 mg/dL (ref 6–24)
Bilirubin Total: <0.2 mg/dL (ref 0.0–1.2)
CO2: 25 mmol/L (ref 20–29)
Calcium: 9 mg/dL (ref 8.7–10.2)
Chloride: 104 mmol/L (ref 96–106)
Creatinine, Ser: 0.86 mg/dL (ref 0.57–1.00)
GFR calc Af Amer: 92 mL/min/{1.73_m2} (ref >59)
GFR calc non Af Amer: 80 mL/min/{1.73_m2} (ref >59)
Globulin, Total: 1.8 g/dL (ref 1.5–4.5)
Glucose: 108 mg/dL — ABNORMAL HIGH (ref 65–99)
Potassium: 4.6 mmol/L (ref 3.5–5.2)
Sodium: 140 mmol/L (ref 134–144)
Total Protein: 6.1 g/dL (ref 6.0–8.5)

## 2019-06-10 LAB — SAR COV2 SEROLOGY (COVID19)AB(IGG),IA: DiaSorin SARS-CoV-2 Ab, IgG: POSITIVE

## 2019-06-10 LAB — LIPID PANEL
Chol/HDL Ratio: 3.6 ratio (ref 0.0–4.4)
Cholesterol, Total: 209 mg/dL — ABNORMAL HIGH (ref 100–199)
HDL: 58 mg/dL (ref >39)
LDL Chol Calc (NIH): 115 mg/dL — ABNORMAL HIGH (ref 0–99)
Triglycerides: 209 mg/dL — ABNORMAL HIGH (ref 0–149)
VLDL Cholesterol Cal: 36 mg/dL (ref 5–40)

## 2019-06-10 LAB — HEMOGLOBIN A1C
Est. average glucose Bld gHb Est-mCnc: 105 mg/dL
Hgb A1c MFr Bld: 5.3 % (ref 4.8–5.6)

## 2020-01-06 ENCOUNTER — Ambulatory Visit (INDEPENDENT_AMBULATORY_CARE_PROVIDER_SITE_OTHER): Payer: BC Managed Care – PPO

## 2020-01-06 ENCOUNTER — Encounter: Payer: Self-pay | Admitting: Emergency Medicine

## 2020-01-06 ENCOUNTER — Ambulatory Visit: Payer: BC Managed Care – PPO | Admitting: Emergency Medicine

## 2020-01-06 ENCOUNTER — Other Ambulatory Visit: Payer: Self-pay

## 2020-01-06 VITALS — BP 113/73 | HR 76 | Temp 98.5°F | Resp 16 | Ht 62.0 in | Wt 127.0 lb

## 2020-01-06 DIAGNOSIS — Z1211 Encounter for screening for malignant neoplasm of colon: Secondary | ICD-10-CM

## 2020-01-06 DIAGNOSIS — M25511 Pain in right shoulder: Secondary | ICD-10-CM

## 2020-01-06 DIAGNOSIS — S46811A Strain of other muscles, fascia and tendons at shoulder and upper arm level, right arm, initial encounter: Secondary | ICD-10-CM

## 2020-01-06 DIAGNOSIS — M62838 Other muscle spasm: Secondary | ICD-10-CM

## 2020-01-06 MED ORDER — MELOXICAM 7.5 MG PO TABS: 7.5000 mg | ORAL_TABLET | Freq: Every day | ORAL | 0 refills | Status: DC

## 2020-01-06 MED ORDER — CYCLOBENZAPRINE HCL 10 MG PO TABS: 10.0000 mg | ORAL_TABLET | Freq: Every day | ORAL | 0 refills | Status: DC

## 2020-01-06 NOTE — Progress Notes (Signed)
Ariana Edwards 49 y.o.   Chief Complaint  Patient presents with  . Shoulder Pain    Right for 3 week on the upper side of the shoulder and back, per patient hurts to move it making cracking sounds    HISTORY OF PRESENT ILLNESS: This is a 49 y.o. female complaining of right shoulder pain for the past 3 weeks.  Denies injuries.  No other associated symptoms. Constant sharp pain worse with movement.  HPI   Prior to Admission medications   Medication Sig Start Date End Date Taking? Authorizing Provider  Vitamin D, Ergocalciferol, (DRISDOL) 1.25 MG (50000 UT) CAPS capsule Take 50,000 Units by mouth every 7 (seven) days.   Yes [provider]  cetirizine (ZYRTEC) 10 MG tablet TAKE 1 TABLET BY MOUTH EVERY DAY Patient not taking: Reported on 01/06/2020 07/29/17   Tenna Delaine D, PA-C  cyclobenzaprine (FLEXERIL) 5 MG tablet Take 1 tablet at night as needed for back spasm. Patient not taking: Reported on 01/06/2020 01/11/16   Darlyne Russian, MD  triamcinolone cream (KENALOG) 0.1 % Apply 1 application topically 2 (two) times daily. Patient not taking: Reported on 01/06/2020 06/02/17   Tenna Delaine D, PA-C    Allergies  Allergen Reactions  . Sulfa Antibiotics Rash    Patient Active Problem List   Diagnosis Date Noted  . Fibroids 07/20/2014  . Fibroid, uterine   . DYSPEPSIA&OTHER [facility] DISORDERS FUNCTION STOMACH 06/23/2008    Past Medical History:  Diagnosis Date  . Hyperlipidemia   . Rosacea   . Uterine fibroid     Past Surgical History:  Procedure Laterality Date  . BREAST SURGERY    . FRACTURE SURGERY    . IR GENERIC HISTORICAL  05/04/2014   IR RADIOLOGIST EVAL & MGMT 05/04/2014 Marybelle Killings, MD [facility] INTERV RAD  . IR GENERIC HISTORICAL  03/07/2015   IR RADIOLOGIST EVAL & MGMT 03/07/2015 [facility] INTERV RAD  . UTERINE FIBROID EMBOLIZATION      Social History   Socioeconomic History  . Marital status: Married    Spouse name: Not on file  . Number of  children: 0  . Years of education: Not on file  . Highest education level: Not on file  Occupational History  . Not on file  Tobacco Use  . Smoking status: Never Smoker  . Smokeless tobacco: Never Used  Substance and Sexual Activity  . Alcohol use: Yes    Alcohol/week: 1.0 standard drink    Types: 1 Standard drinks or equivalent per week  . Drug use: No  . Sexual activity: Yes    Comment: no birth control  Other Topics Concern  . Not on file  Social History Narrative  . Not on file   Social Determinants of Health   Financial Resource Strain:   . Difficulty of Paying Living Expenses: Not on file  Food Insecurity:   . Worried About Charity fundraiser in the Last Year: Not on file  . Ran Out of Food in the Last Year: Not on file  Transportation Needs:   . Lack of Transportation (Medical): Not on file  . Lack of Transportation (Non-Medical): Not on file  Physical Activity:   . Days of Exercise per Week: Not on file  . Minutes of Exercise per Session: Not on file  Stress:   . Feeling of Stress : Not on file  Social Connections:   . Frequency of Communication with Friends and Family: Not on file  . Frequency  of Social Gatherings with Friends and Family: Not on file  . Attends Religious Services: Not on file  . Active Member of Clubs or Organizations: Not on file  . Attends Archivist Meetings: Not on file  . Marital Status: Not on file  Intimate Partner Violence:   . Fear of Current or Ex-Partner: Not on file  . Emotionally Abused: Not on file  . Physically Abused: Not on file  . Sexually Abused: Not on file    Family History  Problem Relation Age of Onset  . Hypertension Mother   . Hypothyroidism Sister      Review of Systems  Constitutional: Negative.  Negative for chills and fever.  HENT: Negative for congestion and sore throat.        Still having persistent loss of smell and taste since Covid infection last January  Respiratory: Negative.   Negative for cough.   Cardiovascular: Negative.  Negative for chest pain and palpitations.  Gastrointestinal: Negative.  Negative for abdominal pain, diarrhea, nausea and vomiting.  Genitourinary: Negative.   Musculoskeletal: Positive for back pain.  Skin: Negative.  Negative for rash.  Neurological: Negative.  Negative for dizziness and headaches.  All other systems reviewed and are negative.   Today's Vitals   01/06/20 1554  BP: 113/73  Pulse: 76  Resp: 16  Temp: 98.5 F (36.9 C)  TempSrc: Temporal  SpO2: 97%  Weight: 127 lb (57.6 kg)  Height: 5\' 2"  (1.575 m)   Body mass index is 23.23 kg/m.  Physical Exam Vitals reviewed.  Constitutional:      Appearance: Normal appearance.  HENT:     Head: Normocephalic.  Eyes:     Extraocular Movements: Extraocular movements intact.     Pupils: Pupils are equal, round, and reactive to light.  Cardiovascular:     Rate and Rhythm: Normal rate and regular rhythm.     Pulses: Normal pulses.     Heart sounds: Normal heart sounds.  Pulmonary:     Effort: Pulmonary effort is normal.     Breath sounds: Normal breath sounds.  Musculoskeletal:     Cervical back: Normal range of motion.     Comments: Right shoulder: No erythema or swelling.  No localized tenderness.  Full range of motion. Upper back: Some tenderness and spasm to right trapezius muscle  Skin:    General: Skin is warm and dry.     Capillary Refill: Capillary refill takes less than 2 seconds.  Neurological:     General: No focal deficit present.     Mental Status: She is alert and oriented to person, place, and time.  Psychiatric:        Mood and Affect: Mood normal.        Behavior: Behavior normal.    DG Shoulder Right  Result Date: 01/06/2020 CLINICAL DATA:  Acute RIGHT shoulder pain EXAM: RIGHT SHOULDER - 2+ VIEW COMPARISON:  None FINDINGS: Osseous mineralization normal. AC joint alignment normal. No acute fracture, dislocation or bone destruction. Visualized ribs  unremarkable. IMPRESSION: Normal exam. Electronically Signed   By: Lavonia Dana M.D.   On: 01/06/2020 16:41     ASSESSMENT & PLAN: Lovelle was seen today for shoulder pain.  Diagnoses and all orders for this visit:  Acute pain of right shoulder -     DG Shoulder Right  Screening for colon cancer -     Ambulatory referral to Gastroenterology  Strain of right trapezius muscle, initial encounter -  meloxicam (MOBIC) 7.5 MG tablet; Take 1 tablet (7.5 mg total) by mouth daily.  Trapezius muscle spasm -     cyclobenzaprine (FLEXERIL) 10 MG tablet; Take 1 tablet (10 mg total) by mouth at bedtime.    Patient Instructions       If you have lab work done today you will be contacted with your lab results within the next 2 weeks.  If you have not heard from [place] then please contact us. The fastest way to get your results is to register for My Chart.   IF you received an x-ray today, you will receive an invoice from [facility]. Please contact [facility] at [phone] with questions or concerns regarding your invoice.   IF you received labwork today, you will receive an invoice from [facility]. Please contact [facility] at [phone] with questions or concerns regarding your invoice.   Our billing staff will not be able to assist you with questions regarding bills from these companies.  You will be contacted with the lab results as soon as they are available. The fastest way to get your results is to activate your My Chart account. Instructions are located on the last page of this paperwork. If you have not heard from [place] regarding the results in 2 weeks, please contact this office.     Dolor en el hombro Shoulder Pain Muchas cosas pueden provocar dolor en el hombro, por ejemplo:  Una lesin.  Un movimiento del hombro que se repite [place] vez de la misma manera (uso excesivo).  Dolor en las articulaciones (artritis). El dolor puede deberse a lo  siguiente:  Hinchazn e irritacin (inflamacin) de alguna parte del hombro.  Una lesin en la articulacin del hombro.  Una lesin en: ? Los tejidos que conectan el msculo al hueso (tendones). ? Los tejidos que [employer] s (ligamentos). ? Los [employer] Services. Siga estas indicaciones en su casa: Controle los cambios en sus sntomas. Informe a su mdico acerca de los cambios. Estas indicaciones pueden ayudarlo con Conservation officer, historic buildings. Si tiene un cabestrillo:  Use el cabestrillo como se lo haya indicado el mdico. Quteselo solamente como se lo haya indicado el mdico.  Afloje el cabestrillo si los dedos: ? Hormiguean. ? Se adormecen. ? Se tornan fros y de [employer].  Mantenga el cabestrillo limpio.  Si el cabestrillo no es impermeable: ? No deje que se moje. ? Qutese el cabestrillo para ducharse o baarse. Control del dolor, la rigidez y la hinchazn   Si se lo indican, aplique hielo sobre la zona dolorida: ? Field seismologist hielo en una bolsa plstica. ? Coloque una [employer] piel y Therapist, nutritional. ? Coloque el hielo durante 59minutos, 2 a 3veces por da. Deje de aplicarse hielo si no ayuda a Best boy.  Apriete una pelota blanda o una almohadilla de goma tanto como sea posible. Esto impide que el hombro se hinche. Tambin ayuda a Veterinary surgeon. Indicaciones generales  [employer] de venta libre y los recetados solamente como se lo haya indicado el mdico.  Consulting civil engineer a todas las visitas de seguimiento como se lo haya indicado el mdico. Esto es importante. Comunquese con un mdico si:  El Holiday representative.  Los medicamentos no Forensic psychologist.  Siente un dolor nuevo en el brazo, la mano o los dedos. Solicite ayuda inmediatamente si:  El brazo, la mano o los dedos: ? Hormiguean. ? Estn adormecidos. ? Estn hinchados. ? Estn doloridos. ?  Se tornan de color blanco o azul. Resumen  Varias pueden ser las causas del dolor en el hombro. Estas  incluyen lesiones, mover el hombro en el mismo sentido una y Elmon Kirschner, y Social research officer, government en las articulaciones.  Controle los cambios en sus sntomas. Informe a su mdico acerca de los cambios.  Esta afeccin se puede tratar con un cabestrillo, hielo y un medicamento para Conservation officer, historic buildings.  Comunquese con su mdico si el dolor empeora o tiene un dolor nuevo. Solicite ayuda de inmediato si el brazo, la mano o los dedos se le adormecen o si siente hormigueo, se le hinchan o le duelen.  Concurra a todas las visitas de seguimiento como se lo haya indicado el mdico. Esto es importante. Esta informacin no tiene Marine scientist el consejo del mdico. Asegrese de hacerle al mdico cualquier pregunta que tenga. Document Revised: 09/24/2017 Document Reviewed: 09/24/2017 Elsevier Patient Education  2020 Elsevier Inc.      Agustina Caroli, MD [facility]

## 2020-01-06 NOTE — Patient Instructions (Addendum)
If you have lab work done today you will be contacted with your lab results within the next 2 weeks.  If you have not heard from [place] then please contact us. The fastest way to get your results is to register for My Chart.   IF you received an x-ray today, you will receive an invoice from [facility]. Please contact [facility] Radiology at [phone] with questions or concerns regarding your invoice.   IF you received labwork today, you will receive an invoice from [place]. Please contact [facility] at [phone] with questions or concerns regarding your invoice.   Our billing staff will not be able to assist you with questions regarding bills from these companies.  You will be contacted with the lab results as soon as they are available. The fastest way to get your results is to activate your My Chart account. Instructions are located on the last page of this paperwork. If you have not heard from [place] regarding the results in 2 weeks, please contact this office.     Dolor en el hombro Shoulder Pain Muchas cosas pueden provocar dolor en el hombro, por ejemplo:  Una lesin.  Un movimiento del hombro que se repite [place] vez de la misma manera (uso excesivo).  Dolor en las articulaciones (artritis). El dolor puede deberse a lo siguiente:  Hinchazn e irritacin (inflamacin) de alguna parte del hombro.  Una lesin en la articulacin del hombro.  Una lesin en: ? Los tejidos que conectan el msculo al hueso (tendones). ? Los tejidos que [employer] s (ligamentos). ? Los [employer]. Siga estas indicaciones en su casa: Controle los cambios en sus sntomas. Informe a su mdico acerca de los cambios. Estas indicaciones pueden ayudarlo con Conservation officer, historic buildings. Si tiene un cabestrillo:  Use el cabestrillo como se lo haya indicado el mdico. Quteselo solamente como se lo haya indicado el mdico.  Afloje el cabestrillo si los dedos: ? Hormiguean. ? Se  adormecen. ? Se tornan fros y de [employer].  Mantenga el cabestrillo limpio.  Si el cabestrillo no es impermeable: ? No deje que se moje. ? Qutese el cabestrillo para ducharse o baarse. Control del dolor, la rigidez y la hinchazn   Si se lo indican, aplique hielo sobre la zona dolorida: ? Field seismologist hielo en una bolsa plstica. ? Coloque una [employer] piel y Therapist, nutritional. ? Coloque el hielo durante 69minutos, 2 a 3veces por da. Deje de aplicarse hielo si no ayuda a Best boy.  Apriete una pelota blanda o una almohadilla de goma tanto como sea posible. Esto impide que el hombro se hinche. Tambin ayuda a Veterinary surgeon. Indicaciones generales  [employer]phi de venta libre y los recetados solamente como se lo haya indicado el mdico.  Consulting civil engineer a todas las visitas de seguimiento como se lo haya indicado el mdico. Esto es importante. Comunquese con un mdico si:  El Holiday representative.  Los medicamentos no Forensic psychologist.  Siente un dolor nuevo en el brazo, la mano o los dedos. Solicite ayuda inmediatamente si:  El brazo, la mano o los dedos: ? Hormiguean. ? Estn adormecidos. ? Estn hinchados. ? Estn doloridos. ? Se tornan de color blanco o azul. Resumen  Varias pueden ser las causas del dolor en el hombro. Estas incluyen lesiones, mover el hombro en el mismo sentido una y Elmon Kirschner, y Social research officer, government en las articulaciones.  Controle los cambios en sus sntomas. Informe  a su mdico acerca de los cambios.  Esta afeccin se puede tratar con un cabestrillo, hielo y un medicamento para Conservation officer, historic buildings.  Comunquese con su mdico si el dolor empeora o tiene un dolor nuevo. Solicite ayuda de inmediato si el brazo, la mano o los dedos se le adormecen o si siente hormigueo, se le hinchan o le duelen.  Concurra a todas las visitas de seguimiento como se lo haya indicado el mdico. Esto es importante. Esta informacin no tiene Marine scientist el consejo del  mdico. Asegrese de hacerle al mdico cualquier pregunta que tenga. Document Revised: 09/24/2017 Document Reviewed: 09/24/2017 Elsevier Patient Education  2020 [employer].

## 2020-01-27 ENCOUNTER — Other Ambulatory Visit: Payer: Self-pay | Admitting: Emergency Medicine

## 2020-01-27 DIAGNOSIS — S46811A Strain of other muscles, fascia and tendons at shoulder and upper arm level, right arm, initial encounter: Secondary | ICD-10-CM

## 2020-01-27 NOTE — Telephone Encounter (Signed)
Patient is requesting a refill of the following medications: Requested Prescriptions   Pending Prescriptions Disp Refills   meloxicam (MOBIC) 7.5 MG tablet [Pharmacy Med Name: MELOXICAM 7.5 MG TABLET] 30 tablet 0    Sig: TAKE 1 TABLET BY MOUTH EVERY DAY    Date of patient request: 01/27/20 Last office visit: 01/06/20 Date of last refill: 01/06/20 Last refill amount: 30, 0 refills Follow up time period per chart: n/a

## 2020-01-27 NOTE — Telephone Encounter (Signed)
Requested medications are due for refill today yes  Requested medications are on the active medication list yes  Last refill 12/2  Last visit 12/2  Notes to clinic Was just seen and given short supply rx, unsure if was to be continued.

## 2020-04-27 ENCOUNTER — Other Ambulatory Visit: Payer: Self-pay

## 2020-04-27 ENCOUNTER — Ambulatory Visit: Payer: BC Managed Care – PPO | Admitting: Emergency Medicine

## 2020-04-27 ENCOUNTER — Encounter: Payer: Self-pay | Admitting: Emergency Medicine

## 2020-04-27 VITALS — BP 108/73 | HR 75 | Temp 98.4°F | Resp 16 | Ht 62.0 in | Wt 125.0 lb

## 2020-04-27 DIAGNOSIS — M79645 Pain in left finger(s): Secondary | ICD-10-CM | POA: Diagnosis not present

## 2020-04-27 DIAGNOSIS — M549 Dorsalgia, unspecified: Secondary | ICD-10-CM

## 2020-04-27 DIAGNOSIS — Z1211 Encounter for screening for malignant neoplasm of colon: Secondary | ICD-10-CM

## 2020-04-27 NOTE — Patient Instructions (Addendum)
If you have lab work done today you will be contacted with your lab results within the next 2 weeks.  If you have not heard from [place] then please contact us. The fastest way to get your results is to register for My Chart.   IF you received an x-ray today, you will receive an invoice from [facility] Radiology. Please contact [facility] Radiology at [phone] with questions or concerns regarding your invoice.   IF you received labwork today, you will receive an invoice from [place]. Please contact [facility] at [phone] with questions or concerns regarding your invoice.   Our billing staff will not be able to assist you with questions regarding bills from these companies.  You will be contacted with the lab results as soon as they are available. The fastest way to get your results is to activate your My Chart account. Instructions are located on the last page of this paperwork. If you have not heard from [place] regarding the results in 2 weeks, please contact this office.     Dolor musculoesqueltico Musculoskeletal Pain "Dolor musculoesqueltico" hace referencia a los dolores y las [employer] Companies, las articulaciones, los msculos y los tejidos [employer] rodean. Este dolor puede ocurrir en cualquier parte del cuerpo. Puede durar un breve perodo (agudo) o prolongarse mucho tiempo (crnico). Es posible que se realicen un examen fsico, anlisis de laboratorio y estudios de diagnstico por imgenes para Animator causa del dolor musculoesqueltico. Siga estas instrucciones en su casa: Estilo de vida  Trate de controlar o reducir los niveles de Psychologist, forensic. El estrs aumenta la tensin muscular y Biochemist, clinical musculoesqueltico. Es importante reconocer cuando est ansioso o estresado y aprender distintas formas de Market researcher. Esto puede incluir: ? Yoga o meditacin. ? Terapia cognitiva o conductual. ? Acupuntura o terapia de masajes.  Podr seguir con todas las  actividades, a menos que Magazine features editor generen ms [employer]. Cuando el dolor [place], retome las actividades habituales de a poco. Aumente gradualmente la intensidad y la duracin de las actividades o del ejercicio que realice. Control del dolor, la rigidez y la hinchazn  El tratamiento puede incluir medicamentos para Conservation officer, historic buildings y la inflamacin que se toman por boca o que se aplican sobre la piel. Use los medicamentos de venta libre y los recetados solamente como se lo haya indicado el mdico.  Si el dolor es intenso, el reposo en cama puede ser beneficioso. Acustese o sintese en cualquier posicin que sea cmoda, pero salga de la cama y camine al [employer].  Si se lo indican, aplique calor en la zona afectada con la frecuencia que le haya indicado el mdico. Use la fuente de calor que el mdico le recomiende, como una compresa de calor hmedo o una almohadilla trmica. ? Coloque una [employer] piel y la fuente de Freight forwarder. ? Aplique calor durante 20 a 27minutos. ? Retire la fuente de calor si la piel se pone de color rojo brillante. Esto es especialmente importante si no puede sentir dolor, calor o fro. Puede correr un riesgo mayor de sufrir quemaduras.  Si se lo indican, aplique hielo sobre la zona dolorida. Para hacer esto: ? Ponga el hielo en una bolsa plstica. ? Coloque una [employer] piel y Therapist, nutritional. ? Aplique el hielo durante 66minutos, 2 o 3veces por da. ? Retire el hielo si la piel se pone de color rojo brillante. Esto es [employer]. Si no puede  sentir dolor, calor o fro, tiene un mayor riesgo de que se dae la zona.      Indicaciones generales  El mdico puede recomendarle que consulte a un fisioterapeuta. Esta persona puede ayudarlo a elaborar un programa de ejercicios seguro.  Si se lo indica el mdico, haga ejercicios de fisioterapia para mejorar el movimiento y la fuerza de la zona afectada.  Cumpla con todas las visitas de seguimiento. Esto es  importante. [place] visitas al fisioterapeuta. Comunquese con un mdico si:  El Holiday representative.  Los medicamentos no Financial trader.  No puede usar la parte del cuerpo que le duele, como un brazo, una pierna o el cuello.  Tiene dificultad para dormir.  Tiene dificultad para [employer] cotidianas. Solicite ayuda de inmediato si:  Tiene una nueva lesin o el dolor empeora o es diferente.  Tiene adormecimiento u hormigueo en la zona dolorida. Resumen  "Dolor musculoesqueltico" hace referencia a los dolores y las [employer] Companies, las articulaciones, los msculos y los tejidos [employer] rodean.  Este dolor puede ocurrir en cualquier parte del cuerpo.  El mdico puede recomendarle que consulte a un fisioterapeuta. Esta persona puede ayudarlo a elaborar un programa de ejercicios seguro. Haga ejercicios como se lo haya indicado el fisioterapeuta.  Disminuya los niveles de estrs. El estrs puede Occupational hygienist musculoesqueltico. [place] los mtodos para disminuir el estrs se pueden mencionar la meditacin, el yoga, la terapia cognitiva o conductual, la acupuntura y la terapia de [place]. Esta informacin no tiene Marine scientist el consejo del mdico. Asegrese de hacerle al mdico cualquier pregunta que tenga. Document Revised: 07/22/2019 Document Reviewed: 07/22/2019 Elsevier Patient Education  [place].

## 2020-04-27 NOTE — Progress Notes (Signed)
[place] 50 y.o.   Chief Complaint  Patient presents with  . Back Pain    Per patient upper area of back on the right for 3 months  . Hand Pain    Per patient left thumb for 1 week    HISTORY OF PRESENT ILLNESS: This is a 50 y.o. female complaining of pain and spasm to right scapular area for the past 3 months. Also complaining 1 week history of pain to the left thumb area. Denies injuries. No other associated symptoms. No other complaints or medical concerns today. Patient has appointment to see [facility] next Wednesday.  HPI   Prior to Admission medications   Medication Sig Start Date End Date Taking? Authorizing Provider  VITAMIN D PO Take by mouth daily.   Yes [provider]  cetirizine (ZYRTEC) 10 MG tablet TAKE 1 TABLET BY MOUTH EVERY DAY Patient not taking: Reported on 04/27/2020 07/29/17   Tenna Delaine D, PA-C  cyclobenzaprine (FLEXERIL) 10 MG tablet Take 1 tablet (10 mg total) by mouth at bedtime. Patient not taking: Reported on 04/27/2020 01/06/20   Horald Pollen, MD  meloxicam (MOBIC) 7.5 MG tablet TAKE 1 TABLET BY MOUTH EVERY DAY Patient not taking: Reported on 04/27/2020 01/27/20   Horald Pollen, MD  triamcinolone cream (KENALOG) 0.1 % Apply 1 application topically 2 (two) times daily. Patient not taking: Reported on 04/27/2020 06/02/17   Tenna Delaine D, PA-C  Vitamin D, Ergocalciferol, (DRISDOL) 1.25 MG (50000 UT) CAPS capsule Take 50,000 Units by mouth every 7 (seven) days. Patient not taking: Reported on 04/27/2020    [provider]    Allergies  Allergen Reactions  . Sulfa Antibiotics Rash    Patient Active Problem List   Diagnosis Date Noted  . Fibroids 07/20/2014  . Fibroid, uterine   . DYSPEPSIA&OTHER [facility] DISORDERS FUNCTION STOMACH 06/23/2008    Past Medical History:  Diagnosis Date  . Hyperlipidemia   . Rosacea   . Uterine fibroid     Past Surgical History:  Procedure  Laterality Date  . BREAST SURGERY    . FRACTURE SURGERY    . IR GENERIC HISTORICAL  05/04/2014   IR RADIOLOGIST EVAL & MGMT 05/04/2014 Marybelle Killings, MD [facility] INTERV RAD  . IR GENERIC HISTORICAL  03/07/2015   IR RADIOLOGIST EVAL & MGMT 03/07/2015 [facility] INTERV RAD  . UTERINE FIBROID EMBOLIZATION      Social History   Socioeconomic History  . Marital status: Married    Spouse name: Not on file  . Number of children: 0  . Years of education: Not on file  . Highest education level: Not on file  Occupational History  . Not on file  Tobacco Use  . Smoking status: Never Smoker  . Smokeless tobacco: Never Used  Substance and Sexual Activity  . Alcohol use: Yes    Alcohol/week: 1.0 standard drink    Types: 1 Standard drinks or equivalent per week  . Drug use: No  . Sexual activity: Yes    Comment: no birth control  Other Topics Concern  . Not on file  Social History Narrative  . Not on file   Social Determinants of Health   Financial Resource Strain: Not on file  Food Insecurity: Not on file  Transportation Needs: Not on file  Physical Activity: Not on file  Stress: Not on file  Social Connections: Not on file  Intimate Partner Violence: Not on file    Family History  Problem  Relation Age of Onset  . Hypertension Mother   . Hypothyroidism Sister      Review of Systems  Constitutional: Negative.  Negative for chills and fever.  HENT: Negative.  Negative for congestion and sore throat.   Respiratory: Negative.  Negative for cough and shortness of breath.   Cardiovascular: Negative.  Negative for chest pain and palpitations.  Gastrointestinal: Negative.  Negative for abdominal pain, nausea and vomiting.  Genitourinary: Negative.   Musculoskeletal: Positive for back pain. Negative for neck pain.  Skin: Negative.  Negative for rash.  Neurological: Negative.  Negative for dizziness and headaches.  All other systems reviewed and are negative.    Physical Exam Vitals  reviewed.  Constitutional:      Appearance: Normal appearance.  HENT:     Head: Normocephalic.  Eyes:     Extraocular Movements: Extraocular movements intact.     Pupils: Pupils are equal, round, and reactive to light.  Cardiovascular:     Rate and Rhythm: Normal rate.     Pulses: Normal pulses.     Heart sounds: Normal heart sounds.  Pulmonary:     Effort: Pulmonary effort is normal.     Breath sounds: Normal breath sounds.  Musculoskeletal:     Cervical back: Normal range of motion.     Comments: Right scapular area: Mild tenderness with muscle spasm Left thumb: Mild tenderness to thenar area.  No erythema or any other significant findings.  Skin:    General: Skin is warm and dry.     Capillary Refill: Capillary refill takes less than 2 seconds.  Neurological:     General: No focal deficit present.     Mental Status: She is alert and oriented to person, place, and time.     Sensory: No sensory deficit.     Motor: No weakness.  Psychiatric:        Mood and Affect: Mood normal.        Behavior: Behavior normal.      ASSESSMENT & PLAN: Cathlin was seen today for back pain and hand pain.  Diagnoses and all orders for this visit:  Musculoskeletal back pain  Pain of left thumb  Screening for colon cancer -     Ambulatory referral to Gastroenterology    Patient Instructions       If you have lab work done today you will be contacted with your lab results within the next 2 weeks.  If you have not heard from [place] then please contact us. The fastest way to get your results is to register for My Chart.   IF you received an x-ray today, you will receive an invoice from [facility]. Please contact [facility] at [phone] with questions or concerns regarding your invoice.   IF you received labwork today, you will receive an invoice from [facility]. Please contact [facility] at [phone] with questions or concerns regarding your invoice.   Our  billing staff will not be able to assist you with questions regarding bills from these companies.  You will be contacted with the lab results as soon as they are available. The fastest way to get your results is to activate your My Chart account. Instructions are located on the last page of this paperwork. If you have not heard from [place] regarding the results in 2 weeks, please contact this office.     Dolor musculoesqueltico Musculoskeletal Pain "Dolor musculoesqueltico" hace referencia a los dolores y las American Standard Companies, las articulaciones, los Apple Computer  y los tejidos que los rodean. Este dolor puede ocurrir en cualquier parte del cuerpo. Puede durar un breve perodo (agudo) o prolongarse mucho tiempo (crnico). Es posible que se realicen un examen fsico, anlisis de laboratorio y estudios de diagnstico por imgenes para Animator causa del dolor musculoesqueltico. Siga estas instrucciones en su casa: Estilo de vida  Trate de controlar o reducir los niveles de Psychologist, forensic. El estrs aumenta la tensin muscular y Biochemist, clinical musculoesqueltico. Es importante reconocer cuando est ansioso o estresado y aprender distintas formas de Market researcher. Esto puede incluir: ? Yoga o meditacin. ? Terapia cognitiva o conductual. ? Acupuntura o terapia de masajes.  Podr seguir con todas las actividades, a menos que Magazine features editor generen ms [employer]. Cuando el dolor [place], retome las actividades habituales de a poco. Aumente gradualmente la intensidad y la duracin de las actividades o del ejercicio que realice. Control del dolor, la rigidez y la hinchazn  El tratamiento puede incluir medicamentos para Conservation officer, historic buildings y la inflamacin que se toman por boca o que se aplican sobre la piel. Use los medicamentos de venta libre y los recetados solamente como se lo haya indicado el mdico.  Si el dolor es intenso, el reposo en cama puede ser beneficioso. Acustese o sintese en cualquier  posicin que sea cmoda, pero salga de la cama y camine al SunTrust.  Si se lo indican, aplique calor en la zona afectada con la frecuencia que le haya indicado el mdico. Use la fuente de calor que el mdico le recomiende, como una compresa de calor hmedo o una almohadilla trmica. ? Coloque una Gen[employer]ine [employer] piel y la fuente de Freight forwarder. ? Aplique calor durante 20 a 35minutos. ? Retire la fuente de calor si la piel se pone de color rojo brillante. Esto es especialmente importante si no puede sentir dolor, calor o fro. Puede correr un riesgo mayor de sufrir quemaduras.  Si se lo indican, aplique hielo sobre la zona dolorida. Para hacer esto: ? Ponga el hielo en una bolsa plstica. ? Coloque una [employer] piel y Therapist, nutritional. ? Aplique el hielo durante 49minutos, 2 o 3veces por da. ? Retire el hielo si la piel se pone de color rojo brillante. Esto es PepsiCo. Si no puede sentir dolor, calor o fro, tiene un mayor riesgo de que se dae la zona.      Indicaciones generales  El mdico puede recomendarle que consulte a un fisioterapeuta. Esta persona puede ayudarlo a elaborar un programa de ejercicios seguro.  Si se lo indica el mdico, haga ejercicios de fisioterapia para mejorar el movimiento y la fuerza de la zona afectada.  Cumpla con todas las visitas de seguimiento. Esto es importante. [place] visitas al fisioterapeuta. Comunquese con un mdico si:  El Holiday representative.  Los medicamentos no Financial trader.  No puede usar la parte del cuerpo que le duele, como un brazo, una pierna o el cuello.  Tiene dificultad para dormir.  Tiene dificultad para Calpine Corporation cotidianas. Solicite ayuda de inmediato si:  Tiene una nueva lesin o el dolor empeora o es diferente.  Tiene adormecimiento u hormigueo en la zona dolorida. Resumen  "Dolor musculoesqueltico" hace referencia a los dolores y las American Standard Companies, las articulaciones,  los msculos y los tejidos Avaya rodean.  Este dolor puede ocurrir en cualquier parte del cuerpo.  El mdico puede recomendarle que consulte a un fisioterapeuta. Esta  persona puede ayudarlo a elaborar un programa de ejercicios seguro. Haga ejercicios como se lo haya indicado el fisioterapeuta.  Disminuya los niveles de estrs. El estrs puede Occupational hygienist musculoesqueltico. [place] los mtodos para disminuir el estrs se pueden mencionar la meditacin, el yoga, la terapia cognitiva o conductual, la acupuntura y la terapia de [place]. Esta informacin no tiene Marine scientist el consejo del mdico. Asegrese de hacerle al mdico cualquier pregunta que tenga. Document Revised: 07/22/2019 Document Reviewed: 07/22/2019 Elsevier Patient Education  2021 Elsevier Inc.       Agustina Caroli, MD [facility]

## 2020-07-12 ENCOUNTER — Other Ambulatory Visit: Payer: Self-pay

## 2020-07-12 ENCOUNTER — Ambulatory Visit (AMBULATORY_SURGERY_CENTER): Payer: Self-pay | Admitting: *Deleted

## 2020-07-12 VITALS — Ht 62.0 in | Wt 124.0 lb

## 2020-07-12 DIAGNOSIS — Z1211 Encounter for screening for malignant neoplasm of colon: Secondary | ICD-10-CM

## 2020-07-12 MED ORDER — PEG 3350-KCL-NA BICARB-NACL 420 G PO SOLR: 4000.0000 mL | Freq: Once | ORAL | 0 refills | Status: AC

## 2020-07-12 NOTE — Progress Notes (Signed)

## 2020-07-21 ENCOUNTER — Encounter: Payer: Self-pay | Admitting: Gastroenterology

## 2020-07-25 ENCOUNTER — Encounter: Payer: Self-pay | Admitting: Certified Registered Nurse Anesthetist

## 2020-07-26 ENCOUNTER — Other Ambulatory Visit: Payer: Self-pay

## 2020-07-26 ENCOUNTER — Encounter: Payer: Self-pay | Admitting: Gastroenterology

## 2020-07-26 ENCOUNTER — Ambulatory Visit (AMBULATORY_SURGERY_CENTER): Payer: BC Managed Care – PPO | Admitting: Gastroenterology

## 2020-07-26 VITALS — BP 110/63 | HR 66 | Temp 98.6°F | Resp 16 | Ht 62.0 in | Wt 124.0 lb

## 2020-07-26 DIAGNOSIS — D123 Benign neoplasm of transverse colon: Secondary | ICD-10-CM

## 2020-07-26 DIAGNOSIS — D128 Benign neoplasm of rectum: Secondary | ICD-10-CM

## 2020-07-26 DIAGNOSIS — Z1211 Encounter for screening for malignant neoplasm of colon: Secondary | ICD-10-CM | POA: Diagnosis present

## 2020-07-26 MED ORDER — SODIUM CHLORIDE 0.9 % IV SOLN: 500.0000 mL | INTRAVENOUS | Status: DC

## 2020-07-26 NOTE — Progress Notes (Signed)
Report given to PACU, vss 

## 2020-07-26 NOTE — Progress Notes (Signed)
Called to room to assist during endoscopic procedure.  Patient ID and intended procedure confirmed with present staff. Received instructions for my participation in the procedure from the performing physician.  

## 2020-07-26 NOTE — Op Note (Signed)
[place] Patient Name: Aidan [facility] Procedure Date: 07/26/2020 11:52 AM MRN: [id number] Endoscopist: Mauri Pole , MD Age: 50 Referring MD:  Date of Birth: [birth year] Gender: Female Account #: [id number] Procedure:                Colonoscopy Indications:              Screening for colorectal malignant neoplasm Medicines:                Monitored Anesthesia Care Procedure:                Pre-Anesthesia Assessment:                           - Prior to the procedure, a History and Physical                            was performed, and patient medications and                            allergies were reviewed. The patient's tolerance of                            previous anesthesia was also reviewed. The risks                            and benefits of the procedure and the sedation                            options and risks were discussed with the patient.                            All questions were answered, and informed consent                            was obtained. Prior Anticoagulants: The patient has                            taken no previous anticoagulant or antiplatelet                            agents. ASA Grade Assessment: I - A normal, healthy                            patient. After reviewing the risks and benefits,                            the patient was deemed in satisfactory condition to                            undergo the procedure.                           After obtaining informed consent, the colonoscope  was passed under direct vision. Throughout the                            procedure, the patient's blood pressure, pulse, and                            oxygen saturations were monitored continuously. The                            Olympus PFC-[id number] (#[id number]) Colonoscope was                            introduced through the anus and advanced to the the                            cecum,  identified by appendiceal orifice and                            ileocecal valve. The colonoscopy was performed                            without difficulty. The patient tolerated the                            procedure well. The quality of the bowel                            preparation was excellent. The ileocecal valve,                            appendiceal orifice, and rectum were photographed. Scope In: 12:05:04 PM Scope Out: 12:18:02 PM Scope Withdrawal Time: 0 hours 7 minutes 22 seconds  Total Procedure Duration: 0 hours 12 minutes 58 seconds  Findings:                 The perianal and digital rectal examinations were                            normal.                           Four sessile polyps were found in the rectum and                            transverse colon. The polyps were 1 to 2 mm in                            size. These polyps were removed with a cold biopsy                            forceps. Resection and retrieval were complete.                           Non-bleeding external and internal hemorrhoids were  found during retroflexion. The hemorrhoids were                            small. Complications:            No immediate complications. Estimated Blood Loss:     Estimated blood loss was minimal. Impression:               - Four 1 to 2 mm polyps in the rectum and in the                            transverse colon, removed with a cold biopsy                            forceps. Resected and retrieved.                           - Non-bleeding external and internal hemorrhoids. Recommendation:           - Patient has a contact number available for                            emergencies. The signs and symptoms of potential                            delayed complications were discussed with the                            patient. Return to normal activities tomorrow.                            Written discharge instructions were  provided to the                            patient.                           - Resume previous diet.                           - Continue present medications.                           - Await pathology results.                           - Repeat colonoscopy in 5-10 years for surveillance                            based on pathology results. Mauri Pole, MD 07/26/2020 12:22:51 PM This report has been signed electronically.

## 2020-07-26 NOTE — Patient Instructions (Addendum)
Handouts given on polyps and hemorrhoids  YOU HAD AN ENDOSCOPIC PROCEDURE TODAY AT [place]:   Refer to the procedure report that was given to you for any specific questions about what was found during the examination.  If the procedure report does not answer your questions, please call your gastroenterologist to clarify.  If you requested that your care partner not be given the details of your procedure findings, then the procedure report has been included in a sealed envelope for you to review at your convenience later.  YOU SHOULD EXPECT: Some feelings of bloating in the abdomen. Passage of more gas than usual.  Walking can help get rid of the air that was put into your GI tract during the procedure and reduce the bloating. If you had a lower endoscopy (such as a colonoscopy or flexible sigmoidoscopy) you may notice spotting of blood in your stool or on the toilet paper. If you underwent a bowel prep for your procedure, you may not have a normal bowel movement for a few days.  Please Note:  You might notice some irritation and congestion in your nose or some drainage.  This is from the oxygen used during your procedure.  There is no need for concern and it should clear up in a day or so.  SYMPTOMS TO REPORT IMMEDIATELY:  Following lower endoscopy (colonoscopy or flexible sigmoidoscopy):  Excessive amounts of blood in the stool  Significant tenderness or worsening of abdominal pains  Swelling of the abdomen that is new, acute  Fever of 100F or higher   For urgent or emergent issues, a gastroenterologist can be reached at any hour by calling [phone]. Do not use MyChart messaging for urgent concerns.    DIET:  We do recommend a small meal at first, but then you may proceed to your regular diet.  Drink plenty of fluids but you should avoid alcoholic beverages for 24 hours.  ACTIVITY:  You should plan to take it easy for the rest of today and you should NOT DRIVE or  use heavy machinery until tomorrow (because of the sedation medicines used during the test).    FOLLOW UP: Our staff will call the number listed on your records 48-72 hours following your procedure to check on you and address any questions or concerns that you may have regarding the information given to you following your procedure. If we do not reach you, we will leave a message.  We will attempt to reach you two times.  During this call, we will ask if you have developed any symptoms of COVID 19. If you develop any symptoms (ie: fever, flu-like symptoms, shortness of breath, cough etc.) before then, please call [phone].  If you test positive for Covid 19 in the 2 weeks post procedure, please call and report this information to [place].    If any biopsies were taken you will be contacted by phone or by letter within the next 1-3 weeks.  Please call us at [phone] if you have not heard about the biopsies in 3 weeks.    SIGNATURES/CONFIDENTIALITY: You and/or your care partner have signed paperwork which will be entered into your electronic medical record.  These signatures attest to the fact that that the information above on your After Visit Summary has been reviewed and is understood.  Full responsibility of the confidentiality of this discharge information lies with you and/or your care-partner.

## 2020-07-26 NOTE — Progress Notes (Signed)
Vs by cw; previsit with EM.  No changes to health hx since previsit

## 2020-07-28 ENCOUNTER — Telehealth: Payer: Self-pay

## 2020-07-28 NOTE — Telephone Encounter (Signed)
First attempt follow up call to pt, no answer. 

## 2020-08-03 ENCOUNTER — Ambulatory Visit (INDEPENDENT_AMBULATORY_CARE_PROVIDER_SITE_OTHER): Payer: BC Managed Care – PPO | Admitting: Emergency Medicine

## 2020-08-03 ENCOUNTER — Encounter: Payer: Self-pay | Admitting: Emergency Medicine

## 2020-08-03 ENCOUNTER — Other Ambulatory Visit: Payer: Self-pay

## 2020-08-03 VITALS — BP 100/60 | HR 81 | Temp 98.4°F | Ht 62.0 in | Wt 124.4 lb

## 2020-08-03 DIAGNOSIS — Z13228 Encounter for screening for other metabolic disorders: Secondary | ICD-10-CM | POA: Diagnosis not present

## 2020-08-03 DIAGNOSIS — Z1159 Encounter for screening for other viral diseases: Secondary | ICD-10-CM | POA: Diagnosis not present

## 2020-08-03 DIAGNOSIS — Z1329 Encounter for screening for other suspected endocrine disorder: Secondary | ICD-10-CM

## 2020-08-03 DIAGNOSIS — Z130 Encounter for screening for diseases of the blood and blood-forming organs and certain disorders involving the immune mechanism: Secondary | ICD-10-CM | POA: Diagnosis not present

## 2020-08-03 DIAGNOSIS — N951 Menopausal and female climacteric states: Secondary | ICD-10-CM

## 2020-08-03 DIAGNOSIS — Z23 Encounter for immunization: Secondary | ICD-10-CM | POA: Diagnosis not present

## 2020-08-03 DIAGNOSIS — Z114 Encounter for screening for human immunodeficiency virus [HIV]: Secondary | ICD-10-CM | POA: Diagnosis not present

## 2020-08-03 DIAGNOSIS — R5383 Other fatigue: Secondary | ICD-10-CM

## 2020-08-03 DIAGNOSIS — Z0000 Encounter for general adult medical examination without abnormal findings: Secondary | ICD-10-CM | POA: Diagnosis not present

## 2020-08-03 DIAGNOSIS — Z13220 Encounter for screening for lipoid disorders: Secondary | ICD-10-CM

## 2020-08-03 NOTE — Progress Notes (Signed)
[name] 50 y.o.   Chief Complaint  Patient presents with   Annual Exam    HISTORY OF PRESENT ILLNESS: This is a 50 y.o. female here for annual exam. Healthy female with a healthy lifestyle. Perimenopausal.  Complaining of occasional lack of energy. GYN history of uterine fibroids.  Needs new GYN referral. Colonoscopy done last week and results reviewed with patient.  Benign polyps and hemorrhoids otherwise unremarkable. No other complaints or medical concerns today.  HPI   Prior to Admission medications   Medication Sig Start Date End Date Taking? Authorizing Provider  VITAMIN D PO Take by mouth daily.   Yes [provider]    Allergies  Allergen Reactions   Meloxicam Other (See Comments)    abd  pain    Sulfa Antibiotics Rash    Patient Active Problem List   Diagnosis Date Noted   Fibroids 07/20/2014   Fibroid, uterine    DYSPEPSIA&OTHER [facility] DISORDERS FUNCTION STOMACH 06/23/2008    Past Medical History:  Diagnosis Date   Arthritis    left hand    Hyperlipidemia    Rosacea    Uterine fibroid     Past Surgical History:  Procedure Laterality Date   BREAST SURGERY     x 2 for Fibroids    FRACTURE SURGERY     HAND SURGERY     IR GENERIC HISTORICAL  05/04/2014   IR RADIOLOGIST EVAL & MGMT 05/04/2014 Marybelle Killings, MD [facility] INTER[facility] RAD   IR GENERIC HISTORICAL  03/07/2015   IR RADIOLOGIST EVAL & MGMT 03/07/2015 [facility] INTERV RAD- for uterine fibroids    UTERINE FIBROID EMBOLIZATION      Social History   Socioeconomic History   Marital status: Married    Spouse name: Not on file   Number of children: 0   Years of education: Not on file   Highest education level: Not on file  Occupational History   Not on file  Tobacco Use   Smoking status: Never   Smokeless tobacco: Never  Substance and Sexual Activity   Alcohol use: Yes    Alcohol/week: 1.0 standard drink    Types: 1 Standard drinks or equivalent per week    Comment: social  weekends    Drug use: No   Sexual activity: Yes    Comment: no birth control  Other Topics Concern   Not on file  Social History Narrative   Not on file   Social Determinants of Health   Financial Resource Strain: Not on file  Food Insecurity: Not on file  Transportation Needs: Not on file  Physical Activity: Not on file  Stress: Not on file  Social Connections: Not on file  Intimate Partner Violence: Not on file    Family History  Problem Relation Age of Onset   Hypertension Mother    Hypothyroidism Sister    Colon cancer Neg Hx    Colon polyps Neg Hx    Esophageal cancer Neg Hx    Rectal cancer Neg Hx    Stomach cancer Neg Hx      Review of Systems  Constitutional: Negative.  Negative for chills and fever.  HENT: Negative.  Negative for congestion and sore throat.   Respiratory: Negative.  Negative for cough and shortness of breath.   Cardiovascular: Negative.  Negative for chest pain and palpitations.  Gastrointestinal: Negative.  Negative for abdominal pain, blood in stool, diarrhea, melena, nausea and vomiting.  Genitourinary: Negative.  Negative for dysuria and  hematuria.  Musculoskeletal: Negative.  Negative for back pain, myalgias and neck pain.  Skin: Negative.  Negative for rash.  Neurological:  Negative for dizziness and headaches.  All other systems reviewed and are negative.  Today's Vitals   08/03/20 1507  BP: 100/60  Pulse: 81  Temp: 98.4 F (36.9 C)  TempSrc: Oral  SpO2: 98%  Weight: 124 lb 6.4 oz (56.4 kg)  Height: 5\' 2"  (1.575 m)   Body mass index is 22.75 kg/m.  Physical Exam Vitals reviewed.  Constitutional:      Appearance: Normal appearance.  HENT:     Head: Normocephalic and atraumatic.     Right Ear: Tympanic membrane, ear canal and external ear normal.     Left Ear: Tympanic membrane, ear canal and external ear normal.     Mouth/Throat:     Mouth: Mucous membranes are moist.     Pharynx: Oropharynx is clear.  Eyes:      Extraocular Movements: Extraocular movements intact.     Conjunctiva/sclera: Conjunctivae normal.     Pupils: Pupils are equal, round, and reactive to light.  Cardiovascular:     Rate and Rhythm: Normal rate and regular rhythm.     Pulses: Normal pulses.     Heart sounds: Normal heart sounds.  Pulmonary:     Effort: Pulmonary effort is normal.     Breath sounds: Normal breath sounds.  Abdominal:     General: Bowel sounds are normal. There is no distension.     Palpations: Abdomen is soft. There is no mass.     Tenderness: There is no abdominal tenderness.  Musculoskeletal:        General: Normal range of motion.     Cervical back: Normal range of motion and neck supple.     Right lower leg: No edema.     Left lower leg: No edema.  Skin:    General: Skin is warm and dry.     Capillary Refill: Capillary refill takes less than 2 seconds.  Neurological:     General: No focal deficit present.     Mental Status: She is alert and oriented to person, place, and time.  Psychiatric:        Mood and Affect: Mood normal.        Behavior: Behavior normal.     ASSESSMENT & PLAN: Ariana Edwards was seen today for annual exam.  Diagnoses and all orders for this visit:  Routine general medical examination at a health care facility -     Ambulatory referral to Gynecology  Need for shingles vaccine -     Varicella-zoster vaccine IM  Need for hepatitis C screening test -     Hepatitis C antibody screen  Screening for HIV (human immunodeficiency virus) -     HIV antibody  Screening for deficiency anemia -     CBC with Differential  Screening for lipoid disorders -     Lipid panel  Screening for endocrine, metabolic and immunity disorder -     Comprehensive metabolic panel -     Hemoglobin A1c -     TSH -     Urinalysis  Perimenopausal -     Ambulatory referral to Gynecology  Lack of energy  Other orders -     LDL cholesterol, direct   Modifiable risk factors discussed with  patient. Anticipatory guidance according to age provided. The following topics were also discussed: Social Determinants of Health Smoking Diet and nutrition Benefits of exercise Cancer  screening and review of recent colonoscopy report and mammogram Vaccinations recommendations Cardiovascular risk assessment Mental health including depression and anxiety Fall and accident prevention  Patient Instructions  Mantenimiento de Technical sales engineer en [place] Maintenance, Female Adoptar un estilo de vida saludable y recibir atencin preventiva son importantes para promover la salud y Musician. Consulte al mdico sobre: El esquema adecuado para hacerse pruebas y exmenes peridicos. Cosas que puede hacer por su cuenta para prevenir enfermedades y [employer]. Qu debo saber sobre la dieta, el peso y el ejercicio? Consuma una dieta saludable  Consuma una dieta que incluya muchas verduras, frutas, productos lcteos con bajo contenido de [place] y Advertising account planner. No consuma muchos alimentos ricos en grasas slidas, azcares agregados o sodio.  Mantenga un peso saludable El ndice de masa muscular [facility]) se [place] para identificar problemas de [place]. Proporciona una estimacin de la grasa corporal basndose en el peso y la altura. Su mdico puede ayudarle a Radiation protection practitioner [place] y a Scientist, forensic o Theatre manager unpeso saludable. Haga ejercicio con regularidad Haga ejercicio con regularidad. Esta es una de las prcticas ms importantes que puede hacer por su salud. [place] [employer] de los adultos deben seguir estas pautas: Optometrist, al menos, 150 minutos de actividad fsica por semana. El ejercicio debe aumentar la frecuencia cardaca y Nature conservation officer transpirar (ejercicio de intensidad moderada). Hacer ejercicios de fortalecimiento por lo [employer] por semana. Agregue esto a su plan de ejercicio de intensidad moderada. Pasar menos tiempo sentados. Incluso la actividad fsica ligera puede ser  beneficiosa. Controle sus niveles de colesterol y lpidos en la sangre Comience a realizarse anlisis de lpidos y colesterol en la sangre a los20 aos y luego reptalos cada 5 aos. Hgase controlar los niveles de colesterol con mayor frecuencia si: Sus niveles de lpidos y colesterol son altos. Es mayor de 9 aos. Presenta un alto riesgo de padecer enfermedades cardacas. Qu debo saber sobre las pruebas de deteccin del cncer? Segn su historia clnica y sus antecedentes familiares, es posible que deba realizarse pruebas de deteccin del cncer en diferentes edades. Esto puede incluir pruebas de deteccin de lo siguiente: Cncer de mama. Cncer de cuello uterino. Cncer colorrectal. Cncer de piel. Cncer de pulmn. Qu debo saber sobre la enfermedad cardaca, la diabetes y la hipertensinarterial? Presin arterial y enfermedad cardaca La hipertensin arterial causa enfermedades cardacas y [place] el riesgo de accidente cerebrovascular. Es ms probable que esto se manifieste en las personas que tienen lecturas de presin arterial alta, tienen ascendencia africana o tienen sobrepeso. Hgase controlar la presin arterial: Cada 3 a 5 aos si tiene entre 18 y 73 aos. Todos los aos si es mayor de 40 aos. Diabetes Realcese exmenes de deteccin de la diabetes con regularidad. Este anlisis revisa el nivel de azcar en la sangre en [place]. Hgase las pruebas de deteccin: Cada tres aos despus de los 96 aos de edad si tiene un peso normal y un bajo riesgo de padecer diabetes. Con ms frecuencia y a partir de [place] edad inferior si tiene sobrepeso o un alto riesgo de padecer diabetes. Qu debo saber sobre la prevencin de infecciones? Hepatitis B Si tiene un riesgo ms alto de contraer hepatitis B, debe someterse a un examen de deteccin de este virus. Hable con el mdico para averiguar si tiene riesgode contraer la infeccin por hepatitis B. Hepatitis C Se recomienda el anlisis  a: [employer] 1945 y 1965. Todas las personas que tengan un riesgo de haber contrado  hepatitis C. Enfermedades de transmisin sexual (ETS) Hgase las pruebas de Programme researcher, broadcasting/film/video de ITS, incluidas la gonorrea y la clamidia, si: Es sexualmente activa y es menor de 2 aos. Es mayor de 78 aos, y Investment banker, operational informa que corre riesgo de tener este tipo de infecciones. La actividad sexual ha cambiado desde que le hicieron la ltima prueba de deteccin y tiene un riesgo mayor de Best boy clamidia o Radio broadcast assistant. Pregntele al mdico si usted tiene riesgo. Pregntele al mdico si usted tiene un alto riesgo de Museum/gallery curator VIH. El mdico tambin puede recomendarle un medicamento recetado para ayudar a evitar la infeccin por el VIH. Si elige tomar medicamentos para prevenir el VIH, primero debe [employer] de deteccin del VIH. Luego debe hacerse anlisis cada 3 meses mientras est tomando los medicamentos. Embarazo Si est por dejar de Librarian, academic (fase premenopusica) y usted puede quedar [place], busque asesoramiento antes de [place]. Tome de 400 a 800 microgramos (mcg) de cido [employer] si [place]. Pida mtodos de control de la natalidad (anticonceptivos) si desea evitar un embarazo no deseado. Osteoporosis y [place] La osteoporosis es una enfermedad en la que los huesos pierden los minerales y la fuerza por el avance de la edad. El resultado pueden ser fracturas en los [place]. Si tiene 36 aos o ms, o si est en riesgo de sufrir osteoporosis y fracturas, pregunte a su mdico si debe: Hacerse pruebas de deteccin de prdida sea. Tomar un suplemento de calcio o de vitamina D para reducir el riesgo de fracturas. Recibir terapia de reemplazo hormonal (TRH) para tratar los sntomas de la menopausia. Siga estas instrucciones en su casa: Estilo de vida No consuma ningn producto que contenga nicotina o tabaco, como cigarrillos, cigarrillos electrnicos y  tabaco de Higher education careers adviser. Si necesita ayuda para dejar de fumar, consulte al mdico. No consuma drogas. No comparta agujas. Solicite ayuda a su mdico si necesita apoyo o informacin para abandonar las drogas. Consumo de alcohol No beba alcohol si: Su mdico le indica no hacerlo. Est embarazada, puede estar embarazada o est tratando de [place]. Si bebe alcohol: Limite la cantidad que consume de 0 a 1 medida por da. Limite la ingesta si est amamantando. Est atento a la cantidad de alcohol que hay en las bebidas que toma. En los [place], una medida equivale a una botella de cerveza de 12 oz (355 ml), un vaso de vino de 5 oz (148 ml) o un vaso de una bebida alcohlica de alta graduacin de 1 oz (44 ml). Instrucciones generales Realcese los estudios de rutina de la salud, dentales y de Public librarian. [place]. Infrmele a su mdico si: Se siente deprimida con frecuencia. Alguna vez ha sido vctima de [place] o no se siente segura en su casa. Resumen Adoptar un estilo de vida saludable y recibir atencin preventiva son importantes para promover la salud y Musician. Siga las instrucciones del mdico acerca de una dieta saludable, el ejercicio y la realizacin de pruebas o exmenes para Engineer, building services. Siga las instrucciones del mdico con respecto al control del colesterol y la presin arterial. Esta informacin no tiene Marine scientist el consejo del mdico. Asegresede hacerle al mdico cualquier pregunta que tenga. Document Revised: 02/11/2018 Document Reviewed: 02/11/2018 Elsevier Patient Education  2022 [place], MD [facility]

## 2020-08-03 NOTE — Patient Instructions (Signed)
Mantenimiento de Technical sales engineer en [place] Maintenance, Female Adoptar un estilo de vida saludable y recibir atencin preventiva son importantes para promover la salud y Musician. Consulte al mdico sobre: El esquema adecuado para hacerse pruebas y exmenes peridicos. Cosas que puede hacer por su cuenta para prevenir enfermedades y [employer]. Qu debo saber sobre la dieta, el peso y el ejercicio? Consuma una dieta saludable  Consuma una dieta que incluya muchas verduras, frutas, productos lcteos con bajo contenido de [place] y Advertising account planner. No consuma muchos alimentos ricos en grasas slidas, azcares agregados o sodio.  Mantenga un peso saludable El ndice de masa muscular [facility]) se [place] para identificar problemas de [place]. Proporciona una estimacin de la grasa corporal basndose en el peso y la altura. Su mdico puede ayudarle a Radiation protection practitioner [place] y a Scientist, forensic o Theatre manager unpeso saludable. Haga ejercicio con regularidad Haga ejercicio con regularidad. Esta es una de las prcticas ms importantes que puede hacer por su salud. La mayora de los adultos deben seguir estas pautas: Optometrist, al menos, 193minutos de actividad fsica por semana. El ejercicio debe aumentar la frecuencia cardaca y Nature conservation officer transpirar (ejercicio de intensidad moderada). Hacer ejercicios de fortalecimiento por lo [employer] por semana. Agregue esto a su plan de ejercicio de intensidad moderada. Pasar menos tiempo sentados. Incluso la actividad fsica ligera puede ser beneficiosa. Controle sus niveles de colesterol y lpidos en la sangre Comience a realizarse anlisis de lpidos y colesterol en la sangre a los20aos y luego reptalos cada 5aos. Hgase controlar los niveles de colesterol con mayor frecuencia si: Sus niveles de lpidos y colesterol son altos. Es mayor de 40aos. Presenta un alto riesgo de padecer enfermedades cardacas. Qu debo saber sobre las pruebas de deteccin del  cncer? Segn su historia clnica y sus antecedentes familiares, es posible que deba realizarse pruebas de deteccin del cncer en diferentes edades. Esto puede incluir pruebas de deteccin de lo siguiente: Cncer de mama. Cncer de cuello uterino. Cncer colorrectal. Cncer de piel. Cncer de pulmn. Qu debo saber sobre la enfermedad cardaca, la diabetes y la hipertensinarterial? Presin arterial y enfermedad cardaca La hipertensin arterial causa enfermedades cardacas y [place] el riesgo de accidente cerebrovascular. Es ms probable que esto se manifieste en las personas que tienen lecturas de presin arterial alta, tienen ascendencia africana o tienen sobrepeso. Hgase controlar la presin arterial: Cada 3 a 5 aos si tiene entre 18 y 65 aos. Todos los aos si es mayor de [place]. Diabetes Realcese exmenes de deteccin de la diabetes con regularidad. Este anlisis revisa el nivel de azcar en la sangre en [place]. Hgase las pruebas de deteccin: Cada tresaos despus de los 71aos de edad si tiene un peso normal y un bajo riesgo de padecer diabetes. Con ms frecuencia y a partir de [place] edad inferior si tiene sobrepeso o un alto riesgo de padecer diabetes. Qu debo saber sobre la prevencin de infecciones? Hepatitis B Si tiene un riesgo ms alto de contraer hepatitis B, debe someterse a un examen de deteccin de este virus. Hable con el mdico para averiguar si tiene riesgode contraer la infeccin por hepatitis B. Hepatitis C Se recomienda el anlisis a: [employer] 1945 y 1965. Todas las personas que tengan un riesgo de haber contrado hepatitis C. Enfermedades de transmisin sexual (ETS) Hgase las pruebas de Programme researcher, broadcasting/film/video de ITS, incluidas la gonorrea y la clamidia, si: Es sexualmente activa y es menor de [place]. Es mayor de 24aos, y Probation officer  le informa que corre riesgo de tener este tipo de infecciones. La actividad sexual ha cambiado desde que le  hicieron la ltima prueba de deteccin y tiene un riesgo mayor de Best boy clamidia o Radio broadcast assistant. Pregntele al mdico si usted tiene riesgo. Pregntele al mdico si usted tiene un alto riesgo de Museum/gallery curator VIH. El mdico tambin puede recomendarle un medicamento recetado para ayudar a evitar la infeccin por el VIH. Si elige tomar medicamentos para prevenir el VIH, primero debe [employer] de deteccin del VIH. Luego debe hacerse anlisis cada 23meses mientras est tomando los medicamentos. Embarazo Si est por dejar de Librarian, academic (fase premenopusica) y usted puede quedar [place], busque asesoramiento antes de [place]. Tome de 400 a [id number] (mcg) de cido [employer] si [place]. Pida mtodos de control de la natalidad (anticonceptivos) si desea evitar un embarazo no deseado. Osteoporosis y [place] La osteoporosis es una enfermedad en la que los huesos pierden los minerales y la fuerza por el avance de la edad. El resultado pueden ser fracturas en los [place]. Si tiene 65aos o ms, o si est en riesgo de sufrir osteoporosis y fracturas, pregunte a su mdico si debe: Hacerse pruebas de deteccin de prdida sea. Tomar un suplemento de calcio o de vitamina D para reducir el riesgo de fracturas. Recibir terapia de reemplazo hormonal (TRH) para tratar los sntomas de la menopausia. Siga estas instrucciones en su casa: Estilo de vida No consuma ningn producto que contenga nicotina o tabaco, como cigarrillos, cigarrillos electrnicos y tabaco de Higher education careers adviser. Si necesita ayuda para dejar de fumar, consulte al mdico. No consuma drogas. No comparta agujas. Solicite ayuda a su mdico si necesita apoyo o informacin para abandonar las drogas. Consumo de alcohol No beba alcohol si: Su mdico le indica no hacerlo. Est embarazada, puede estar embarazada o est tratando de [place]. Si bebe alcohol: Limite la cantidad que consume de 0 a 1 medida por  da. Limite la ingesta si est amamantando. Est atento a la cantidad de alcohol que hay en las bebidas que toma. En los [place], una medida equivale a una botella de cerveza de 12oz (387ml), un vaso de vino de 5oz (120ml) o un vaso de una bebida alcohlica de alta graduacin de 1oz (84ml). Instrucciones generales Realcese los estudios de rutina de la salud, dentales y de Public librarian. [place]. Infrmele a su mdico si: Se siente deprimida con frecuencia. Alguna vez ha sido vctima de [place] o no se siente segura en su casa. Resumen Adoptar un estilo de vida saludable y recibir atencin preventiva son importantes para promover la salud y Musician. Siga las instrucciones del mdico acerca de una dieta saludable, el ejercicio y la realizacin de pruebas o exmenes para Engineer, building services. Siga las instrucciones del mdico con respecto al control del colesterol y la presin arterial. Esta informacin no tiene Marine scientist el consejo del mdico. Asegresede hacerle al mdico cualquier pregunta que tenga. Document Revised: 02/11/2018 Document Reviewed: 02/11/2018 Elsevier Patient Education  [place].

## 2020-08-04 LAB — HEPATITIS C ANTIBODY
Hepatitis C Ab: NONREACTIVE
SIGNAL TO CUT-OFF: 0 (ref <1.00)

## 2020-08-04 LAB — LIPID PANEL
Cholesterol: 205 mg/dL — ABNORMAL HIGH (ref 0–200)
HDL: 65.1 mg/dL (ref >39.00)
NonHDL: 139.89
Total CHOL/HDL Ratio: 3
Triglycerides: 227 mg/dL — ABNORMAL HIGH (ref 0.0–149.0)
VLDL: 45.4 mg/dL — ABNORMAL HIGH (ref 0.0–40.0)

## 2020-08-04 LAB — LDL CHOLESTEROL, DIRECT: Direct LDL: 109 mg/dL

## 2020-08-04 LAB — CBC WITH DIFFERENTIAL/PLATELET
Basophils Absolute: 0.1 10*3/uL (ref 0.0–0.1)
Basophils Relative: 1 % (ref 0.0–3.0)
Eosinophils Absolute: 0.3 10*3/uL (ref 0.0–0.7)
Eosinophils Relative: 3.4 % (ref 0.0–5.0)
HCT: 40.3 % (ref 36.0–46.0)
Hemoglobin: 13.5 g/dL (ref 12.0–15.0)
Lymphocytes Relative: 25.3 % (ref 12.0–46.0)
Lymphs Abs: 2.3 10*3/uL (ref 0.7–4.0)
MCHC: 33.6 g/dL (ref 30.0–36.0)
MCV: 89.9 fl (ref 78.0–100.0)
Monocytes Absolute: 0.4 10*3/uL (ref 0.1–1.0)
Monocytes Relative: 4.5 % (ref 3.0–12.0)
Neutro Abs: 5.9 10*3/uL (ref 1.4–7.7)
Neutrophils Relative %: 65.8 % (ref 43.0–77.0)
Platelets: 291 10*3/uL (ref 150.0–400.0)
RBC: 4.48 Mil/uL (ref 3.87–5.11)
RDW: 13.5 % (ref 11.5–15.5)
WBC: 9 10*3/uL (ref 4.0–10.5)

## 2020-08-04 LAB — URINALYSIS
Bilirubin Urine: NEGATIVE
Hgb urine dipstick: NEGATIVE
Ketones, ur: NEGATIVE
Leukocytes,Ua: NEGATIVE
Nitrite: NEGATIVE
Specific Gravity, Urine: 1.015 (ref 1.000–1.030)
Total Protein, Urine: NEGATIVE
Urine Glucose: NEGATIVE
Urobilinogen, UA: 0.2 (ref 0.0–1.0)
pH: 7.5 (ref 5.0–8.0)

## 2020-08-04 LAB — HEMOGLOBIN A1C: Hgb A1c MFr Bld: 5.6 % (ref 4.6–6.5)

## 2020-08-04 LAB — COMPREHENSIVE METABOLIC PANEL
ALT: 9 U/L (ref 0–35)
AST: 13 U/L (ref 0–37)
Albumin: 4.2 g/dL (ref 3.5–5.2)
Alkaline Phosphatase: 51 U/L (ref 39–117)
BUN: 13 mg/dL (ref 6–23)
CO2: 29 mEq/L (ref 19–32)
Calcium: 9.3 mg/dL (ref 8.4–10.5)
Chloride: 104 mEq/L (ref 96–112)
Creatinine, Ser: 0.93 mg/dL (ref 0.40–1.20)
GFR: 71.42 mL/min (ref >60.00)
Glucose, Bld: 81 mg/dL (ref 70–99)
Potassium: 4.7 mEq/L (ref 3.5–5.1)
Sodium: 141 mEq/L (ref 135–145)
Total Bilirubin: 0.3 mg/dL (ref 0.2–1.2)
Total Protein: 6.6 g/dL (ref 6.0–8.3)

## 2020-08-04 LAB — HIV ANTIBODY (ROUTINE TESTING W REFLEX): HIV 1&2 Ab, 4th Generation: NONREACTIVE

## 2020-08-04 LAB — TSH: TSH: 2.81 u[IU]/mL (ref 0.35–5.50)

## 2020-08-18 ENCOUNTER — Telehealth: Payer: Self-pay | Admitting: Gastroenterology

## 2020-08-18 NOTE — Telephone Encounter (Signed)
Patient had also sent a message through her My Chart which has been responded to. No results are available yet.

## 2020-08-18 NOTE — Telephone Encounter (Signed)
Inbound call from pt requesting a call back stating she was waiting to see if her results had came back from her procedure. Please advise. Thanks.

## 2020-08-28 ENCOUNTER — Encounter: Payer: Self-pay | Admitting: Gastroenterology

## 2020-08-30 ENCOUNTER — Ambulatory Visit (INDEPENDENT_AMBULATORY_CARE_PROVIDER_SITE_OTHER): Payer: BC Managed Care – PPO | Admitting: Obstetrics & Gynecology

## 2020-08-30 ENCOUNTER — Other Ambulatory Visit (HOSPITAL_COMMUNITY)
Admission: RE | Admit: 2020-08-30 | Discharge: 2020-08-30 | Disposition: A | Discharge status: Home / Self Care | Payer: BC Managed Care – PPO | Source: Ambulatory Visit | Attending: Obstetrics & Gynecology | Admitting: Obstetrics & Gynecology

## 2020-08-30 ENCOUNTER — Encounter: Payer: Self-pay | Admitting: Obstetrics & Gynecology

## 2020-08-30 ENCOUNTER — Other Ambulatory Visit: Payer: Self-pay

## 2020-08-30 VITALS — BP 106/64 | HR 92 | Resp 16 | Ht 61.25 in | Wt 126.0 lb

## 2020-08-30 DIAGNOSIS — D259 Leiomyoma of uterus, unspecified: Secondary | ICD-10-CM

## 2020-08-30 DIAGNOSIS — N914 Secondary oligomenorrhea: Secondary | ICD-10-CM | POA: Diagnosis not present

## 2020-08-30 DIAGNOSIS — Z01419 Encounter for gynecological examination (general) (routine) without abnormal findings: Secondary | ICD-10-CM | POA: Insufficient documentation

## 2020-08-30 DIAGNOSIS — N951 Menopausal and female climacteric states: Secondary | ICD-10-CM | POA: Diagnosis not present

## 2020-08-30 DIAGNOSIS — N841 Polyp of cervix uteri: Secondary | ICD-10-CM

## 2020-08-30 NOTE — Progress Notes (Signed)
[place] 05-May-1970 [id number]   History:    50 y.o. G0 Married.  ESL Pharmacist, hospital.  Originally from [place].  RP:  New patient presenting for annual gyn exam   HPI: Oligomenorrhea x 2 years.  Irregular light menses x last year.  Heavy bleeding a year ago 09/2019.  Left pelvic discomfort x a long time, probably d/t fibroids per patient.  No pain with IC.  Breasts normal.  BMI 23.61.  Health labs with Fam MD.  Past medical history,surgical history, family history and social history were all reviewed and documented in the EPIC chart.  Gynecologic History Patient's last menstrual period was 08/21/2020.  Obstetric History OB History  Gravida Para Term Preterm AB Living  0 0 0 0 0 0  SAB IAB Ectopic Multiple Live Births  0 0 0 0 0     ROS: A ROS was performed and pertinent positives and negatives are included in the history.  GENERAL: No fevers or chills. HEENT: No change in vision, no earache, sore throat or sinus congestion. NECK: No pain or stiffness. CARDIOVASCULAR: No chest pain or pressure. No palpitations. PULMONARY: No shortness of breath, cough or wheeze. GASTROINTESTINAL: No abdominal pain, nausea, vomiting or diarrhea, melena or bright red blood per rectum. GENITOURINARY: No urinary frequency, urgency, hesitancy or dysuria. MUSCULOSKELETAL: No joint or muscle pain, no back pain, no recent trauma. DERMATOLOGIC: No rash, no itching, no lesions. ENDOCRINE: No polyuria, polydipsia, no heat or cold intolerance. No recent change in weight. HEMATOLOGICAL: No anemia or easy bruising or bleeding. NEUROLOGIC: No headache, seizures, numbness, tingling or weakness. PSYCHIATRIC: No depression, no loss of interest in normal activity or change in sleep pattern.     Exam:   BP 106/64   Pulse 92   Resp 16   Ht 5' 1.25" (1.556 m)   Wt 126 lb (57.2 kg)   LMP 08/21/2020 Comment: no contraception  BMI 23.61 kg/m   Body mass index is 23.61 kg/m.  General appearance : Well  developed well nourished female. No acute distress HEENT: Eyes: no retinal hemorrhage or exudates,  Neck supple, trachea midline, no carotid bruits, no thyroidmegaly Lungs: Clear to auscultation, no rhonchi or wheezes, or rib retractions  Heart: Regular rate and rhythm, no murmurs or gallops Breast:Examined in sitting and supine position were symmetrical in appearance, no palpable masses or tenderness,  no skin retraction, no nipple inversion, no nipple discharge, no skin discoloration, no axillary or supraclavicular lymphadenopathy Abdomen: no palpable masses or tenderness, no rebound or guarding Extremities: no edema or skin discoloration or tenderness  Pelvic: Vulva: Normal             Vagina: No gross lesions or discharge  Cervix: No discharge. Pap reflex done. Endocervical Polyp visible at EO.  Verbal consent to remove polyp.  Hurricane spray.  Easy removal of Endocervical Polyp by clamping and rotating with a fenestrated clamp.  Specimen sent to pathology.  Silver Nitrate at the base for hemostasis.  Well tolerated, no CI.  Uterus  AV, mild increase in size, nodular fibroids, non-tender and mobile  Adnexa  Without masses or tenderness  Anus: Normal   Assessment/Plan:  50 y.o. female for annual exam   1. Encounter for routine gynecological examination with Papanicolaou smear of cervix Normal gynecologic exam with endocervical polyp and probable uterine fibroids.  Pap reflex done.  Breast exam normal.  We will schedule a screening mammogram.  Colonoscopy June 2022.  Health labs with family physician.  Good body mass index at 23.61.  Continue with fitness and healthy nutrition. - Cytology - PAP( [facility])  2. Uterine leiomyoma, unspecified location Uterus is mildly increased in the 80s in size and nodular.  We will follow-up with pelvic ultrasound to evaluate fibroids and rule out other pathologies. - US Transvaginal Non-OB; Future  3. Secondary oligomenorrhea Secondary  oligomenorrhea.  Follow-up a week ultrasound. - US Transvaginal Non-OB; Future  4. Perimenopause  Probable perimenopause associated with oligomenorrhea.  Recommend vitamin D supplements, calcium intake of 1.5 g/day total and regular weightbearing physical activities.  5. Endocervical polyp Endocervical polyp easily removed after verbal consent.  Specimen sent to pathology.  Hemostasis completed at the base with silver nitrate. - Surgical pathology( [facility])    Princess Bruins MD, 3:25 PM 08/30/2020

## 2020-09-01 LAB — CYTOLOGY - PAP: Diagnosis: NEGATIVE

## 2020-09-01 LAB — SURGICAL PATHOLOGY

## 2020-09-03 ENCOUNTER — Encounter: Payer: Self-pay | Admitting: Obstetrics & Gynecology

## 2020-09-06 ENCOUNTER — Other Ambulatory Visit: Payer: Self-pay | Admitting: Emergency Medicine

## 2020-09-06 DIAGNOSIS — Z1231 Encounter for screening mammogram for malignant neoplasm of breast: Secondary | ICD-10-CM

## 2020-09-07 ENCOUNTER — Other Ambulatory Visit: Payer: Self-pay

## 2020-09-07 ENCOUNTER — Ambulatory Visit
Admission: RE | Admit: 2020-09-07 | Discharge: 2020-09-07 | Disposition: A | Discharge status: Home / Self Care | Payer: BC Managed Care – PPO | Source: Ambulatory Visit

## 2020-09-07 DIAGNOSIS — Z1231 Encounter for screening mammogram for malignant neoplasm of breast: Secondary | ICD-10-CM

## 2020-10-17 ENCOUNTER — Other Ambulatory Visit: Payer: BC Managed Care – PPO

## 2020-10-17 ENCOUNTER — Other Ambulatory Visit: Payer: BC Managed Care – PPO | Admitting: Obstetrics & Gynecology

## 2020-12-07 ENCOUNTER — Other Ambulatory Visit: Payer: BC Managed Care – PPO

## 2020-12-07 ENCOUNTER — Other Ambulatory Visit: Payer: BC Managed Care – PPO | Admitting: Obstetrics & Gynecology

## 2020-12-21 ENCOUNTER — Encounter: Payer: Self-pay | Admitting: Obstetrics & Gynecology

## 2020-12-21 ENCOUNTER — Ambulatory Visit (INDEPENDENT_AMBULATORY_CARE_PROVIDER_SITE_OTHER): Payer: BC Managed Care – PPO

## 2020-12-21 ENCOUNTER — Other Ambulatory Visit: Payer: Self-pay

## 2020-12-21 ENCOUNTER — Ambulatory Visit: Payer: BC Managed Care – PPO | Admitting: Obstetrics & Gynecology

## 2020-12-21 VITALS — BP 102/64

## 2020-12-21 DIAGNOSIS — D259 Leiomyoma of uterus, unspecified: Secondary | ICD-10-CM

## 2020-12-21 DIAGNOSIS — N914 Secondary oligomenorrhea: Secondary | ICD-10-CM | POA: Diagnosis not present

## 2020-12-21 DIAGNOSIS — N951 Menopausal and female climacteric states: Secondary | ICD-10-CM

## 2020-12-21 NOTE — Progress Notes (Signed)
    Ariana Edwards [birth year] [id number]        50 y.o.  G0 Married.  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  No hot flashes or night sweats.  Occasional heavy menses in 2021. Resolved left pelvic discomfort, which had been present x a long time, probably d/t fibroids per patient. H/O Uterine Artery Embolization to treat Fibroids.  No pain with IC.  Urine and bowel movements normal.   OB History  Gravida Para Term Preterm AB Living  0 0 0 0 0 0  SAB IAB Ectopic Multiple Live Births  0 0 0 0 0    Past medical history,surgical history, problem list, medications, allergies, family history and social history were all reviewed and documented in the EPIC chart.   Directed ROS with pertinent positives and negatives documented in the history of present illness/assessment and plan.  Exam:  Vitals:   12/21/20 1630  BP: 102/64   General appearance:  Normal  Pelvic US today: T/V images.  Anteverted enlarged uterus with multiple subserosal fibroids noted, the largest is midline measured at 5.2 x 5.4 cm.  Probable pedunculated fibroid in the right adnexa adjacent to the right ovary measured at 1.6 x 0.9 cm.  The overall uterine size is measured at 10.58 x 5.68 x 5.94 cm.  The endometrial canal is not visualized due to shadowing from the large calcified fibroids.  Right ovary is atrophic in appearance.  Left ovary is not visualized vaginally or abdominally.  No free fluid in the pelvis.   Assessment/Plan:  50 y.o. G0  1. Uterine leiomyoma, unspecified location Resolved pelvic pain and menorrhagia.  Known uterine fibroids for which patient had uterine artery embolization many years ago.  Pelvic ultrasound findings thoroughly reviewed with patient.  Patient reassured of the benign appearance of the fibroids and the location which is subserosal and pedunculated.  The left ovary was not visualized but  no left adnexal mass was seen.  The right ovary was atrophic and normal. Precautions for recurrent pelvic pain and heavy irregular vaginal bleeding discussed.  Decision to observe with a repeat pelvic ultrasound in a year.  Patient voiced understanding and agreement with plan. - US Transvaginal Non-OB; Future  2. Perimenopause Probably in perimenopause with oligomenorrhea.  Last menstrual period July 2022 with normal flow.  Precautions discussed for irregular or heavy bleeding.  We will repeat a pelvic ultrasound in 1 year if no abnormal bleeding or recurrent pain in the meantime. - US Transvaginal Non-OB; Future   Princess Bruins MD, 4:37 PM 12/21/2020

## 2020-12-23 ENCOUNTER — Encounter: Payer: Self-pay | Admitting: Obstetrics & Gynecology

## 2021-07-27 ENCOUNTER — Other Ambulatory Visit: Payer: Self-pay | Admitting: Emergency Medicine

## 2021-07-27 DIAGNOSIS — Z1231 Encounter for screening mammogram for malignant neoplasm of breast: Secondary | ICD-10-CM

## 2021-08-09 ENCOUNTER — Ambulatory Visit (INDEPENDENT_AMBULATORY_CARE_PROVIDER_SITE_OTHER): Payer: BC Managed Care – PPO | Admitting: Emergency Medicine

## 2021-08-09 ENCOUNTER — Encounter: Payer: Self-pay | Admitting: Emergency Medicine

## 2021-08-09 VITALS — BP 100/72 | HR 80 | Temp 97.9°F | Wt 123.1 lb

## 2021-08-09 DIAGNOSIS — Z13228 Encounter for screening for other metabolic disorders: Secondary | ICD-10-CM

## 2021-08-09 DIAGNOSIS — Z800 Family history of malignant neoplasm of digestive organs: Secondary | ICD-10-CM | POA: Diagnosis not present

## 2021-08-09 DIAGNOSIS — Z13220 Encounter for screening for lipoid disorders: Secondary | ICD-10-CM | POA: Diagnosis not present

## 2021-08-09 DIAGNOSIS — Z0000 Encounter for general adult medical examination without abnormal findings: Secondary | ICD-10-CM

## 2021-08-09 DIAGNOSIS — M2550 Pain in unspecified joint: Secondary | ICD-10-CM

## 2021-08-09 DIAGNOSIS — Z1329 Encounter for screening for other suspected endocrine disorder: Secondary | ICD-10-CM

## 2021-08-09 DIAGNOSIS — Z130 Encounter for screening for diseases of the blood and blood-forming organs and certain disorders involving the immune mechanism: Secondary | ICD-10-CM

## 2021-08-09 LAB — COMPREHENSIVE METABOLIC PANEL
ALT: 16 U/L (ref 0–35)
AST: 20 U/L (ref 0–37)
Albumin: 4.5 g/dL (ref 3.5–5.2)
Alkaline Phosphatase: 60 U/L (ref 39–117)
BUN: 18 mg/dL (ref 6–23)
CO2: 31 mEq/L (ref 19–32)
Calcium: 10 mg/dL (ref 8.4–10.5)
Chloride: 104 mEq/L (ref 96–112)
Creatinine, Ser: 0.96 mg/dL (ref 0.40–1.20)
GFR: 68.26 mL/min (ref >60.00)
Glucose, Bld: 89 mg/dL (ref 70–99)
Potassium: 4.6 mEq/L (ref 3.5–5.1)
Sodium: 140 mEq/L (ref 135–145)
Total Bilirubin: 0.4 mg/dL (ref 0.2–1.2)
Total Protein: 7 g/dL (ref 6.0–8.3)

## 2021-08-09 LAB — CBC WITH DIFFERENTIAL/PLATELET
Basophils Absolute: 0.1 10*3/uL (ref 0.0–0.1)
Basophils Relative: 1.1 % (ref 0.0–3.0)
Eosinophils Absolute: 0.4 10*3/uL (ref 0.0–0.7)
Eosinophils Relative: 6.6 % — ABNORMAL HIGH (ref 0.0–5.0)
HCT: 43.3 % (ref 36.0–46.0)
Hemoglobin: 14.4 g/dL (ref 12.0–15.0)
Lymphocytes Relative: 36.2 % (ref 12.0–46.0)
Lymphs Abs: 2.4 10*3/uL (ref 0.7–4.0)
MCHC: 33.3 g/dL (ref 30.0–36.0)
MCV: 89.9 fl (ref 78.0–100.0)
Monocytes Absolute: 0.4 10*3/uL (ref 0.1–1.0)
Monocytes Relative: 5.4 % (ref 3.0–12.0)
Neutro Abs: 3.4 10*3/uL (ref 1.4–7.7)
Neutrophils Relative %: 50.7 % (ref 43.0–77.0)
Platelets: 259 10*3/uL (ref 150.0–400.0)
RBC: 4.82 Mil/uL (ref 3.87–5.11)
RDW: 13.5 % (ref 11.5–15.5)
WBC: 6.7 10*3/uL (ref 4.0–10.5)

## 2021-08-09 LAB — LIPID PANEL
Cholesterol: 262 mg/dL — ABNORMAL HIGH (ref 0–200)
HDL: 69.8 mg/dL (ref >39.00)
LDL Cholesterol: 166 mg/dL — ABNORMAL HIGH (ref 0–99)
NonHDL: 192.09
Total CHOL/HDL Ratio: 4
Triglycerides: 129 mg/dL (ref 0.0–149.0)
VLDL: 25.8 mg/dL (ref 0.0–40.0)

## 2021-08-09 LAB — URINALYSIS, ROUTINE W REFLEX MICROSCOPIC
Bilirubin Urine: NEGATIVE
Ketones, ur: NEGATIVE
Nitrite: NEGATIVE
Specific Gravity, Urine: 1.025 (ref 1.000–1.030)
Total Protein, Urine: NEGATIVE
Urine Glucose: NEGATIVE
Urobilinogen, UA: 0.2 (ref 0.0–1.0)
pH: 5.5 (ref 5.0–8.0)

## 2021-08-09 LAB — HEMOGLOBIN A1C: Hgb A1c MFr Bld: 5.8 % (ref 4.6–6.5)

## 2021-08-09 NOTE — Progress Notes (Signed)
Ariana Edwards 51 y.o.   Chief Complaint  Patient presents with   Annual Exam    Hx liver and stomach cancer in family    pain in hands    Pain in hands and fingers    Back Pain    Lower back pain     HISTORY OF PRESENT ILLNESS: This is a 51 y.o. femaleA1A here for annual exam. Healthy female with a healthy lifestyle. Has occasional lower back pain. Also pain to both hands for at least a year.  Has seen orthopedist in the past.  Conservative treatment with suboptimal results.  Symptoms still present. No other complaints or medical concerns today. Has family history of stomach and liver cancer. Most recent colonoscopy showed polyps.  Back Pain Pertinent negatives include no abdominal pain, chest pain, fever or headaches.     Prior to Admission medications   Medication Sig Start Date End Date Taking? Authorizing Provider  VITAMIN D PO Take by mouth daily.   Yes [provider]    Allergies  Allergen Reactions   Meloxicam Other (See Comments)    abd  pain    Sulfa Antibiotics Rash    Patient Active Problem List   Diagnosis Date Noted   Fibroids 07/20/2014   Fibroid, uterine    DYSPEPSIA&OTHER [facility] DISORDERS FUNCTION STOMACH 06/23/2008    Past Medical History:  Diagnosis Date   Arthritis    left hand    Hyperlipidemia    Rosacea    Uterine fibroid     Past Surgical History:  Procedure Laterality Date   BREAST BIOPSY     BREAST SURGERY     x 2 for Fibroids    FRACTURE SURGERY     HAND SURGERY     IR GENERIC HISTORICAL  05/04/2014   IR RADIOLOGIST EVAL & MGMT 05/04/2014 Marybelle Killings, MD [facility] INTERV RAD   IR GENERIC HISTORICAL  03/07/2015   IR RADIOLOGIST EVAL & MGMT 03/07/2015 [facility] INTERV RAD- for uterine fibroids    UTERINE FIBROID EMBOLIZATION      Social History   Socioeconomic History   Marital status: Married    Spouse name: Not on file   Number of children: 0   Years of education: Not on file   Highest education level:  Not on file  Occupational History   Not on file  Tobacco Use   Smoking status: Never   Smokeless tobacco: Never  Substance and Sexual Activity   Alcohol use: Yes    Alcohol/week: 2.0 standard drinks of alcohol    Types: 2 Standard drinks or equivalent per week    Comment: socially   Drug use: No   Sexual activity: Yes    Partners: Male    Birth control/protection: None  Other Topics Concern   Not on file  Social History Narrative   Not on file   Social Determinants of Health   Financial Resource Strain: Not on file  Food Insecurity: Not on file  Transportation Needs: Not on file  Physical Activity: Not on file  Stress: Not on file  Social Connections: Not on file  Intimate Partner Violence: Not on file    Family History  Problem Relation Age of Onset   Hypertension Mother    Thyroid disease Sister    Hypertension Maternal Grandmother    Heart attack Maternal Grandmother    Cancer Maternal Grandfather    Prostate cancer Maternal Grandfather      Review of Systems  Constitutional: Negative.  Negative for chills and fever.  HENT: Negative.  Negative for congestion and sore throat.   Respiratory: Negative.  Negative for cough.   Cardiovascular: Negative.  Negative for chest pain and palpitations.  Gastrointestinal: Negative.  Negative for abdominal pain, diarrhea, nausea and vomiting.  Genitourinary: Negative.   Musculoskeletal:  Positive for back pain and joint pain.  Skin: Negative.  Negative for rash.  Neurological: Negative.  Negative for dizziness and headaches.  All other systems reviewed and are negative.  Today's Vitals   08/09/21 0906  BP: 100/72  Pulse: 80  Temp: 97.9 F (36.6 C)  TempSrc: Oral  SpO2: 99%  Weight: 123 lb 2 oz (55.8 kg)   Body mass index is 23.07 kg/m.   Physical Exam Vitals reviewed.  Constitutional:      Appearance: Normal appearance.  HENT:     Head: Normocephalic.     Right Ear: Tympanic membrane, ear canal and  external ear normal.     Left Ear: Tympanic membrane, ear canal and external ear normal.     Mouth/Throat:     Mouth: Mucous membranes are moist.     Pharynx: Oropharynx is clear.  Eyes:     Extraocular Movements: Extraocular movements intact.     Conjunctiva/sclera: Conjunctivae normal.     Pupils: Pupils are equal, round, and reactive to light.  Cardiovascular:     Rate and Rhythm: Normal rate and regular rhythm.     Pulses: Normal pulses.     Heart sounds: Normal heart sounds.  Pulmonary:     Effort: Pulmonary effort is normal.     Breath sounds: Normal breath sounds.  Abdominal:     General: There is no distension.     Palpations: Abdomen is soft.     Tenderness: There is no abdominal tenderness.  Musculoskeletal:     Cervical back: No tenderness.     Right lower leg: No edema.     Left lower leg: No edema.  Lymphadenopathy:     Cervical: No cervical adenopathy.  Skin:    General: Skin is warm and dry.     Capillary Refill: Capillary refill takes less than 2 seconds.  Neurological:     General: No focal deficit present.     Mental Status: She is alert and oriented to person, place, and time.  Psychiatric:        Mood and Affect: Mood normal.        Behavior: Behavior normal.     ASSESSMENT & PLAN: Problem List Items Addressed This Visit   None Visit Diagnoses     Routine general medical examination at a health care facility    -  Primary   Family history of stomach cancer       Relevant Orders   Ambulatory referral to Gastroenterology   Arthralgia of multiple sites       Screening for deficiency anemia       Relevant Orders   CBC with Differential   Screening for lipoid disorders       Relevant Orders   Lipid panel   Screening for endocrine, metabolic and immunity disorder       Relevant Orders   Comprehensive metabolic panel   Hemoglobin A1c   Urinalysis      Modifiable risk factors discussed with patient. Anticipatory guidance according to age  provided. The following topics were also discussed: Social Determinants of Health Smoking.  Non-smoker Diet and nutrition.  Advised to decrease amount of daily carbohydrate intake  Benefits of exercise Cancer screening and review of most recent mammogram and colonoscopy report. Need for possible upper endoscopy given family history of stomach cancer Vaccinations recommendations Cardiovascular risk assessment The 10-year ASCVD risk score (Arnett DK, et al., 2019) is: 0.8%   Values used to calculate the score:     Age: 68 years     Sex: Female     Is Non-Hispanic African American: No     Diabetic: No     Tobacco smoker: No     Systolic Blood Pressure: 841 mmHg     Is BP treated: No     HDL Cholesterol: 65.1 mg/dL     Total Cholesterol: 205 mg/dL Complains of multiple joints pain.  May benefit from chiropractor.  May need to return to orthopedics for reevaluation. Mental health including depression and anxiety Fall and accident prevention  Patient Instructions  Health Maintenance, Female Adopting a healthy lifestyle and getting preventive care are important in promoting health and wellness. Ask your health care provider about: The right schedule for you to have regular tests and exams. Things you can do on your own to prevent diseases and keep yourself healthy. What should I know about diet, weight, and exercise? Eat a healthy diet  Eat a diet that includes plenty of vegetables, fruits, low-fat dairy products, and lean protein. Do not eat a lot of foods that are high in solid fats, added sugars, or sodium. Maintain a healthy weight Body mass index (BMI) is used to identify weight problems. It estimates body fat based on height and weight. Your health care provider can help determine your BMI and help you achieve or maintain a healthy weight. Get regular exercise Get regular exercise. This is one of the most important things you can do for your health. Most adults should: Exercise  for at least 150 minutes each week. The exercise should increase your heart rate and make you sweat (moderate-intensity exercise). Do strengthening exercises at least twice a week. This is in addition to the moderate-intensity exercise. Spend less time sitting. Even light physical activity can be beneficial. Watch cholesterol and blood lipids Have your blood tested for lipids and cholesterol at 52 years of age, then have this test every 5 years. Have your cholesterol levels checked more often if: Your lipid or cholesterol levels are high. You are older than 52 years of age. You are at high risk for heart disease. What should I know about cancer screening? Depending on your health history and family history, you may need to have cancer screening at various ages. This may include screening for: Breast cancer. Cervical cancer. Colorectal cancer. Skin cancer. Lung cancer. What should I know about heart disease, diabetes, and high blood pressure? Blood pressure and heart disease High blood pressure causes heart disease and increases the risk of stroke. This is more likely to develop in people who have high blood pressure readings or are overweight. Have your blood pressure checked: Every 3-5 years if you are 64-31 years of age. Every year if you are 46 years old or older. Diabetes Have regular diabetes screenings. This checks your fasting blood sugar level. Have the screening done: Once every three years after age 75 if you are at a normal weight and have a low risk for diabetes. More often and at a younger age if you are overweight or have a high risk for diabetes. What should I know about preventing infection? Hepatitis B If you have a higher risk for hepatitis B, you  should be screened for this virus. Talk with your health care provider to find out if you are at risk for hepatitis B infection. Hepatitis C Testing is recommended for: Everyone born from [birth year] through 1966. Anyone with  known risk factors for hepatitis C. Sexually transmitted infections (STIs) Get screened for STIs, including gonorrhea and chlamydia, if: You are sexually active and are younger than 52 years of age. You are older than 52 years of age and your health care provider tells you that you are at risk for this type of infection. Your sexual activity has changed since you were last screened, and you are at increased risk for chlamydia or gonorrhea. Ask your health care provider if you are at risk. Ask your health care provider about whether you are at high risk for HIV. Your health care provider may recommend a prescription medicine to help prevent HIV infection. If you choose to take medicine to prevent HIV, you should first get tested for HIV. You should then be tested every 3 months for as long as you are taking the medicine. Pregnancy If you are about to stop having your period (premenopausal) and you may become pregnant, seek counseling before you get pregnant. Take 400 to 800 micrograms (mcg) of folic acid every day if you become pregnant. Ask for birth control (contraception) if you want to prevent pregnancy. Osteoporosis and menopause Osteoporosis is a disease in which the bones lose minerals and strength with aging. This can result in bone fractures. If you are 17 years old or older, or if you are at risk for osteoporosis and fractures, ask your health care provider if you should: Be screened for bone loss. Take a calcium or vitamin D supplement to lower your risk of fractures. Be given hormone replacement therapy (HRT) to treat symptoms of menopause. Follow these instructions at home: Alcohol use Do not drink alcohol if: Your health care provider tells you not to drink. You are pregnant, may be pregnant, or are planning to become pregnant. If you drink alcohol: Limit how much you have to: 0-1 drink a day. Know how much alcohol is in your drink. In the [place] one drink equals one 12 oz bottle  of beer (355 mL), one 5 oz glass of wine (148 mL), or one 1 oz glass of hard liquor (44 mL). Lifestyle Do not use any products that contain nicotine or tobacco. These products include cigarettes, chewing tobacco, and vaping devices, such as e-cigarettes. If you need help quitting, ask your health care provider. Do not use street drugs. Do not share needles. Ask your health care provider for help if you need support or information about quitting drugs. General instructions Schedule regular health, dental, and eye exams. Stay current with your vaccines. Tell your health care provider if: You often feel depressed. You have ever been abused or do not feel safe at home. Summary Adopting a healthy lifestyle and getting preventive care are important in promoting health and wellness. Follow your health care provider's instructions about healthy diet, exercising, and getting tested or screened for diseases. Follow your health care provider's instructions on monitoring your cholesterol and blood pressure. This information is not intended to replace advice given to you by your health care provider. Make sure you discuss any questions you have with your health care provider. Document Revised: 06/12/2020 Document Reviewed: 06/12/2020 Elsevier Patient Education  [place], MD [facility]

## 2021-08-09 NOTE — Patient Instructions (Signed)

## 2021-08-10 ENCOUNTER — Telehealth: Payer: Self-pay | Admitting: Emergency Medicine

## 2021-08-10 ENCOUNTER — Encounter: Payer: Self-pay | Admitting: Emergency Medicine

## 2021-08-10 NOTE — Telephone Encounter (Signed)
Pt is concerned about her Eosinophils Relative 6.6 High and Urinalysis, Routine w reflex microscopic  being high and abnormal findings.  Pt would like a callback about above concerns with providers recommendations.

## 2021-08-10 NOTE — Telephone Encounter (Signed)
Not concerned about eosinophils in the CBC.  This is usually related to allergies. Urine is concentrated and probably a dirty sample.  But if she is having urinary tract infection symptoms she should bring a urine sample for culture.  No other concerns.  Thanks.

## 2021-08-31 ENCOUNTER — Ambulatory Visit (INDEPENDENT_AMBULATORY_CARE_PROVIDER_SITE_OTHER): Payer: BC Managed Care – PPO | Admitting: Obstetrics & Gynecology

## 2021-08-31 ENCOUNTER — Encounter: Payer: Self-pay | Admitting: Obstetrics & Gynecology

## 2021-08-31 VITALS — BP 112/68 | HR 101 | Ht 61.0 in | Wt 124.0 lb

## 2021-08-31 DIAGNOSIS — R350 Frequency of micturition: Secondary | ICD-10-CM

## 2021-08-31 DIAGNOSIS — B3731 Acute candidiasis of vulva and vagina: Secondary | ICD-10-CM | POA: Diagnosis not present

## 2021-08-31 DIAGNOSIS — D259 Leiomyoma of uterus, unspecified: Secondary | ICD-10-CM

## 2021-08-31 DIAGNOSIS — N951 Menopausal and female climacteric states: Secondary | ICD-10-CM

## 2021-08-31 DIAGNOSIS — Z01419 Encounter for gynecological examination (general) (routine) without abnormal findings: Secondary | ICD-10-CM | POA: Diagnosis not present

## 2021-08-31 MED ORDER — FLUCONAZOLE 150 MG PO TABS
150.0000 mg | ORAL_TABLET | ORAL | 1 refills | Status: AC
Start: 2021-08-31 — End: 2021-09-05

## 2021-08-31 NOTE — Progress Notes (Signed)
Ariana Edwards [birth year] [id number]   History:    51 y.o. G0 Married.  [employer] Pharmacist, hospital.  Originally from [place].   RP:  Established patient presenting for annual gyn exam    HPI: Oligomenorrhea with LMP 02/2021.  Mild night sweats, no hot flushes.  No pelvic pain. Uterine Fibroids.  Urinary frequency, no other UTI Sx.  C/O vaginal itching.  Pap Neg 08/2020.  No h/o abnormal Pap.  Will repeat Pap at 2-3 years.  Breasts normal.  Mammo Neg 09/2020, scheduled next week.  BMI 23.43.  Health labs with Fam MD.    Past medical history,surgical history, family history and social history were all reviewed and documented in the EPIC chart.  Gynecologic History Patient's last menstrual period was 02/23/2021 (exact date).  Obstetric History OB History  Gravida Para Term Preterm AB Living  0 0 0 0 0 0  SAB IAB Ectopic Multiple Live Births  0 0 0 0 0     ROS: A ROS was performed and pertinent positives and negatives are included in the history. GENERAL: No fevers or chills. HEENT: No change in vision, no earache, sore throat or sinus congestion. NECK: No pain or stiffness. CARDIOVASCULAR: No chest pain or pressure. No palpitations. PULMONARY: No shortness of breath, cough or wheeze. GASTROINTESTINAL: No abdominal pain, nausea, vomiting or diarrhea, melena or bright red blood per rectum. GENITOURINARY: No urinary frequency, urgency, hesitancy or dysuria. MUSCULOSKELETAL: No joint or muscle pain, no back pain, no recent trauma. DERMATOLOGIC: No rash, no itching, no lesions. ENDOCRINE: No polyuria, polydipsia, no heat or cold intolerance. No recent change in weight. HEMATOLOGICAL: No anemia or easy bruising or bleeding. NEUROLOGIC: No headache, seizures, numbness, tingling or weakness. PSYCHIATRIC: No depression, no loss of interest in normal activity or change in sleep pattern.     Exam:   BP 112/68   Pulse (!) 101   Ht '5\' 1"'$  (1.549 m)   Wt 124 lb (56.2 kg)   LMP 02/23/2021 (Exact Date)    SpO2 99%   BMI 23.43 kg/m   Body mass index is 23.43 kg/m.  General appearance : Well developed well nourished female. No acute distress HEENT: Eyes: no retinal hemorrhage or exudates,  Neck supple, trachea midline, no carotid bruits, no thyroidmegaly Lungs: Clear to auscultation, no rhonchi or wheezes, or rib retractions  Heart: Regular rate and rhythm, no murmurs or gallops Breast:Examined in sitting and supine position were symmetrical in appearance, no palpable masses or tenderness,  no skin retraction, no nipple inversion, no nipple discharge, no skin discoloration, no axillary or supraclavicular lymphadenopathy Abdomen: no palpable masses or tenderness, no rebound or guarding Extremities: no edema or skin discoloration or tenderness  Pelvic: Vulva: Normal             Vagina: No gross lesions or discharge  Cervix: No gross lesions or discharge  Uterus  AV, overall 9 cm, nodular Midline anterior Fibroid, non-tender and mobile  Adnexa  Without masses or tenderness  Anus: Normal  Pelvic US 12/2020:  T/V images.  Anteverted enlarged uterus with multiple subserosal fibroids noted, the largest is midline measured at 5.2 x 5.4 cm.  Probable pedunculated fibroid in the right adnexa adjacent to the right ovary measured at 1.6 x 0.9 cm.  The overall uterine size is measured at 10.58 x 5.68 x 5.94 cm.  The endometrial canal is not visualized due to shadowing from the large calcified fibroids.  Right ovary is atrophic in appearance.  Left ovary  is not visualized vaginally or abdominally.  No free fluid in the pelvis.  U/A: Yellow clear, Nit Neg, Pro Neg, WBC Neg, RBC, Neg, Bacteria few.  Pending U. Culture.   Assessment/Plan:  51 y.o. female for annual exam   1. Encounter for routine gynecological examination with Papanicolaou smear of cervix Oligomenorrhea with LMP 02/2021.  Mild night sweats, no hot flushes.  No pelvic pain. Uterine Fibroids.  Urinary frequency, no other UTI Sx.  C/O vaginal  itching.  Pap Neg 08/2020.  No h/o abnormal Pap.  Will repeat Pap at 2-3 years.  Breasts normal.  Mammo Neg 09/2020, scheduled next week.  BMI 23.43.  Health labs with Fam MD.   2. Perimenopause Oligomenorrhea with LMP 02/2021.  Mild night sweats, no hot flushes.  No pelvic pain.  3. Uterine leiomyoma, unspecified location Stable on Gyn exam.  4. Yeast vaginitis Clinical yeast vaginitis.  Will treat with Fluconazole.  Usage reviewed, prescription sent to pharmacy.  5. Urinary frequency U/A wnl, will wait on U. Culture. - Urinalysis,Complete w/RFL Culture  Other orders - fluconazole (DIFLUCAN) 150 MG tablet; Take 1 tablet (150 mg total) by mouth every other day for 3 doses.   Princess Bruins MD, 3:31 PM 08/31/2021

## 2021-09-02 LAB — URINE CULTURE
MICRO NUMBER:: 13708256
SPECIMEN QUALITY:: ADEQUATE

## 2021-09-02 LAB — URINALYSIS, COMPLETE W/RFL CULTURE
Bacteria, UA: NONE SEEN /HPF
Bilirubin Urine: NEGATIVE
Glucose, UA: NEGATIVE
Hyaline Cast: NONE SEEN /LPF
Ketones, ur: NEGATIVE
Leukocyte Esterase: NEGATIVE
Nitrites, Initial: NEGATIVE
Protein, ur: NEGATIVE
Specific Gravity, Urine: 1.015 (ref 1.001–1.035)
pH: 7 (ref 5.0–8.0)

## 2021-09-02 LAB — CULTURE INDICATED

## 2021-09-10 ENCOUNTER — Ambulatory Visit
Admission: RE | Admit: 2021-09-10 | Discharge: 2021-09-10 | Disposition: A | Discharge status: Home / Self Care | Payer: BC Managed Care – PPO | Source: Ambulatory Visit | Attending: Emergency Medicine | Admitting: Emergency Medicine

## 2021-09-10 DIAGNOSIS — Z1231 Encounter for screening mammogram for malignant neoplasm of breast: Secondary | ICD-10-CM

## 2022-02-12 ENCOUNTER — Ambulatory Visit: Payer: BC Managed Care – PPO | Admitting: Gastroenterology

## 2022-06-10 ENCOUNTER — Encounter: Payer: Self-pay | Admitting: Nurse Practitioner

## 2022-06-10 ENCOUNTER — Ambulatory Visit: Payer: BC Managed Care – PPO | Admitting: Nurse Practitioner

## 2022-06-10 ENCOUNTER — Other Ambulatory Visit (INDEPENDENT_AMBULATORY_CARE_PROVIDER_SITE_OTHER): Payer: BC Managed Care – PPO

## 2022-06-10 VITALS — BP 110/70 | HR 80 | Ht 61.0 in | Wt 128.4 lb

## 2022-06-10 DIAGNOSIS — Z800 Family history of malignant neoplasm of digestive organs: Secondary | ICD-10-CM

## 2022-06-10 DIAGNOSIS — R140 Abdominal distension (gaseous): Secondary | ICD-10-CM | POA: Diagnosis not present

## 2022-06-10 NOTE — Progress Notes (Signed)
Assessment and Plan   Primary Gi: Marsa Aris, MD  Brief Narrative:  52 y.o. yo female with a past medical history consisting of, but not limited to adenomatous colon polyps   [facility] of gastric cancer in mother in her 13's. Mother lives in [place] and details not available. May of had concurrent liver cancer vrs metastatic disease.  Patient is here to inquire about gastric cancer screening. Other than postprandial bloating patient feels fine without any alarm symptoms.  -Will defer to patient's primary GI.  I am unaware of any official gastric cancer screening guidelines in this country, especially when affected family member is of advanced age. However, it should be noted that patient and her family are from [place] and their risk factors may be different.  -Patient has never been treated for H. pylori.  Will obtain H. pylori serology  Post-prandial bloating.  No alarm features.  Suspect functional/diet related.  -Obtain celiac serologies   History of adenomatous colon polyps.  Four small adenomas removed June 2022 -3 year recall colonoscopy recommended   History of Present Illness   Chief complaint:  wants to discuss need for gastric cancer screening.   Graycen's mother who lives in [place] was diagnosed with gastric and/or liver cancer.  It is unclear whether both were primary cancers or of metastatic spread.  Patient is inquiring about whether she needs an endoscopy for gastric cancer screening.  She has postprandial bloating / flatus and this is sometimes associated with LLQ discomfort.  Other than that she feels fine.  No blood in stool or black stools.  Her weight is stable.  No nausea or vomiting  Previous GI Endoscopies / Labs / Imaging   June 2022 screening colonoscopy  -- Four 1 to 2 mm polyps in the rectum and in the transverse colon, removed with a cold biopsy forceps. Resected and retrieved. - Non-bleeding external and internal hemorrhoids. Diagnosis Surgical [P], colon,  rectum and transverse, polyp (4) - TUBULAR ADENOMA (S). - NO HIGH-GRADE DYSPLASIA OR CARCINOMA      Latest Ref Rng & Units 08/09/2021    9:57 AM 08/03/2020    3:55 PM 06/09/2019    5:27 PM  Hepatic Function  Total Protein 6.0 - 8.3 g/dL 7.0  6.6  6.1   Albumin 3.5 - 5.2 g/dL 4.5  4.2  4.3   AST 0 - 37 U/L 20  13  14    ALT 0 - 35 U/L 16  9  9    Alk Phosphatase 39 - 117 U/L 60  51  62   Total Bilirubin 0.2 - 1.2 mg/dL 0.4  0.3  <9.8        Latest Ref Rng & Units 08/09/2021    9:57 AM 08/03/2020    3:55 PM 06/09/2019    5:51 PM  CBC  WBC 4.0 - 10.5 K/uL 6.7  9.0  8.7   Hemoglobin 12.0 - 15.0 g/dL 11.9  14.7  82.9   Hematocrit 36.0 - 46.0 % 43.3  40.3  42.5   Platelets 150.0 - 400.0 K/uL 259.0  291.0  329     Past Medical History:  Diagnosis Date   Arthritis    left hand    Hyperlipidemia    Rosacea    Uterine fibroid     Past Surgical History:  Procedure Laterality Date   BREAST BIOPSY     BREAST SURGERY     x 2 for Fibroids    FRACTURE SURGERY  HAND SURGERY     IR GENERIC HISTORICAL  05/04/2014   IR RADIOLOGIST EVAL & MGMT 05/04/2014 Jolaine Click, MD [facility] INTERV RAD   IR GENERIC HISTORICAL  03/07/2015   IR RADIOLOGIST EVAL & MGMT 03/07/2015 [facility] INTERV RAD- for uterine fibroids    UTERINE FIBROID EMBOLIZATION      Current Medications, Allergies, Family History and Social History were reviewed in [employer] record.     Current Outpatient Medications  Medication Sig Dispense Refill   VITAMIN D PO Take by mouth daily.     No current facility-administered medications for this visit.    Review of Systems: No chest pain. No shortness of breath. No urinary complaints.    Physical Exam  Wt Readings from Last 3 Encounters:  08/31/21 124 lb (56.2 kg)  08/09/21 123 lb 2 oz (55.8 kg)  08/30/20 126 lb (57.2 kg)    BP 110/70 (BP Location: Left Arm, Patient Position: Sitting, Cuff Size: Normal)   Pulse 80   Ht 5\' 1"  (1.549 m)   Wt 128 lb  6 oz (58.2 kg)   BMI 24.26 kg/m  Constitutional:  Pleasant, generally well appearing female in no acute distress. Psychiatric: Normal mood and affect. Behavior is normal. EENT: Pupils normal.  Conjunctivae are normal. No scleral icterus. Neck supple.  Cardiovascular: Normal rate, regular rhythm.  Pulmonary/chest: Effort normal and breath sounds normal. No wheezing, rales or rhonchi. Abdominal: Soft, nondistended, nontender. Bowel sounds active throughout. There are no masses palpable. No hepatomegaly. Neurological: Alert and oriented to person place and time.  Skin: Skin is warm and dry. No rashes noted.  Willette Cluster, NP  06/10/2022, 1:43 PM  Cc:  Georgina Quint, *

## 2022-06-10 NOTE — Patient Instructions (Addendum)
Your provider has requested that you go to the basement level for lab work before leaving today. Press "B" on the elevator. The lab is located at the first door on the left as you exit the elevator.  _______________________________________________________  If your blood pressure at your visit was 140/90 or greater, please contact your primary care physician to follow up on this.  _______________________________________________________  If you are age 53 or older, your body mass index should be between 23-30. Your Body mass index is 24.26 kg/m. If this is out of the aforementioned range listed, please consider follow up with your Primary Care Provider.  If you are age 32 or younger, your body mass index should be between 19-25. Your Body mass index is 24.26 kg/m. If this is out of the aformentioned range listed, please consider follow up with your Primary Care Provider.   ________________________________________________________  The [facility] providers would like to encourage you to use [facility] to communicate with providers for non-urgent requests or questions.  Due to long hold times on the telephone, sending your provider a message by [facility] may be a faster and more efficient way to get a response.  Please allow 48 business hours for a response.  Please remember that this is for non-urgent requests.  _______________________________________________________ It was a pleasure to see you today!  Thank you for trusting me with your gastrointestinal care!

## 2022-06-11 LAB — H. PYLORI ANTIBODY, IGG: H Pylori IgG: NEGATIVE

## 2022-06-11 LAB — IGA: Immunoglobulin A: 142 mg/dL (ref 47–310)

## 2022-06-11 LAB — IGG: IgG (Immunoglobin G), Serum: 898 mg/dL (ref 600–1640)

## 2022-06-11 LAB — TISSUE TRANSGLUTAMINASE, IGA: (tTG) Ab, IgA: <1 U/mL

## 2022-06-19 NOTE — Progress Notes (Signed)
Reviewed and agree with documentation and assessment and plan. K. Veena Aloysious Vangieson , MD   

## 2022-07-24 ENCOUNTER — Telehealth: Payer: Self-pay

## 2022-07-24 NOTE — Telephone Encounter (Signed)
-----   Message from Meredith Pel, NP sent at 07/23/2022  9:48 PM EDT ----- Sorry Waynetta Sandy.  I just added patient's chart to this message. Thanks ----- Message ----- From: Evalee Jefferson, LPN Sent: 1/61/0960   5:07 PM EDT To: Meredith Pel, NP  I remember this name. I thought I would recognize the patient just by looking. Not so lucky today. Please give me the last name. ----- Message ----- From: Meredith Pel, NP Sent: 07/23/2022   1:20 PM EDT To: Evalee Jefferson, LPN  Beth, will you call Demitra (may need interpreter if family not available) and make sure she saw on mychart that there was no evidence for H. pylori infection or celiac disease.  If she is able to get mother's records and find out whether she had gastric cancer then this information would be helpful to have . Right now with the information that we have there is no reason for a " screening" upper endoscopy which is the reason that she had come to see me.  Now that I have her test results I am happy to see her back in clinic at any time to discuss further.  We certainly need to see her back in the office if she develops upper abdominal pain, nausea, vomiting, weight loss, blood in stool or any other alarm symptoms.  Otherwise we will see her at the time of her next colonoscopy . Thanks

## 2022-07-24 NOTE — Telephone Encounter (Signed)
Using [facility] Mikle Bosworth [id number] attempted to contact the patient. No answer. Message was left of the call for the patient by the interpreter. Will await return call from the patient.

## 2022-08-15 ENCOUNTER — Other Ambulatory Visit (HOSPITAL_COMMUNITY): Payer: Self-pay

## 2022-08-15 ENCOUNTER — Telehealth: Payer: Self-pay

## 2022-08-15 ENCOUNTER — Encounter: Payer: Self-pay | Admitting: Emergency Medicine

## 2022-08-15 ENCOUNTER — Other Ambulatory Visit: Payer: Self-pay | Admitting: Emergency Medicine

## 2022-08-15 ENCOUNTER — Ambulatory Visit (INDEPENDENT_AMBULATORY_CARE_PROVIDER_SITE_OTHER): Payer: BC Managed Care – PPO | Admitting: Emergency Medicine

## 2022-08-15 VITALS — BP 112/74 | HR 81 | Temp 97.8°F | Ht 61.0 in | Wt 135.0 lb

## 2022-08-15 DIAGNOSIS — Z13220 Encounter for screening for lipoid disorders: Secondary | ICD-10-CM | POA: Diagnosis not present

## 2022-08-15 DIAGNOSIS — Z0001 Encounter for general adult medical examination with abnormal findings: Secondary | ICD-10-CM | POA: Diagnosis not present

## 2022-08-15 DIAGNOSIS — Z780 Asymptomatic menopausal state: Secondary | ICD-10-CM | POA: Insufficient documentation

## 2022-08-15 DIAGNOSIS — Z1329 Encounter for screening for other suspected endocrine disorder: Secondary | ICD-10-CM

## 2022-08-15 DIAGNOSIS — Z130 Encounter for screening for diseases of the blood and blood-forming organs and certain disorders involving the immune mechanism: Secondary | ICD-10-CM

## 2022-08-15 DIAGNOSIS — Z13228 Encounter for screening for other metabolic disorders: Secondary | ICD-10-CM

## 2022-08-15 DIAGNOSIS — N951 Menopausal and female climacteric states: Secondary | ICD-10-CM | POA: Insufficient documentation

## 2022-08-15 LAB — CBC WITH DIFFERENTIAL/PLATELET
Basophils Absolute: 0.1 10*3/uL (ref 0.0–0.1)
Basophils Relative: 1 % (ref 0.0–3.0)
Eosinophils Absolute: 0.5 10*3/uL (ref 0.0–0.7)
Eosinophils Relative: 6.7 % — ABNORMAL HIGH (ref 0.0–5.0)
HCT: 41.7 % (ref 36.0–46.0)
Hemoglobin: 13.8 g/dL (ref 12.0–15.0)
Lymphocytes Relative: 33.2 % (ref 12.0–46.0)
Lymphs Abs: 2.4 10*3/uL (ref 0.7–4.0)
MCHC: 33.2 g/dL (ref 30.0–36.0)
MCV: 89.4 fl (ref 78.0–100.0)
Monocytes Absolute: 0.4 10*3/uL (ref 0.1–1.0)
Monocytes Relative: 6.2 % (ref 3.0–12.0)
Neutro Abs: 3.8 10*3/uL (ref 1.4–7.7)
Neutrophils Relative %: 52.9 % (ref 43.0–77.0)
Platelets: 327 10*3/uL (ref 150.0–400.0)
RBC: 4.66 Mil/uL (ref 3.87–5.11)
RDW: 14 % (ref 11.5–15.5)
WBC: 7.2 10*3/uL (ref 4.0–10.5)

## 2022-08-15 LAB — LIPID PANEL
Cholesterol: 322 mg/dL — ABNORMAL HIGH (ref 0–200)
HDL: 80.4 mg/dL (ref >39.00)
LDL Cholesterol: 212 mg/dL — ABNORMAL HIGH (ref 0–99)
NonHDL: 241.1
Total CHOL/HDL Ratio: 4
Triglycerides: 145 mg/dL (ref 0.0–149.0)
VLDL: 29 mg/dL (ref 0.0–40.0)

## 2022-08-15 LAB — COMPREHENSIVE METABOLIC PANEL
ALT: 55 U/L — ABNORMAL HIGH (ref 0–35)
AST: 32 U/L (ref 0–37)
Albumin: 4.4 g/dL (ref 3.5–5.2)
Alkaline Phosphatase: 81 U/L (ref 39–117)
BUN: 15 mg/dL (ref 6–23)
CO2: 32 mEq/L (ref 19–32)
Calcium: 9.8 mg/dL (ref 8.4–10.5)
Chloride: 103 mEq/L (ref 96–112)
Creatinine, Ser: 0.89 mg/dL (ref 0.40–1.20)
GFR: 74.23 mL/min (ref >60.00)
Glucose, Bld: 95 mg/dL (ref 70–99)
Potassium: 4.4 mEq/L (ref 3.5–5.1)
Sodium: 140 mEq/L (ref 135–145)
Total Bilirubin: 0.6 mg/dL (ref 0.2–1.2)
Total Protein: 7.1 g/dL (ref 6.0–8.3)

## 2022-08-15 LAB — HEMOGLOBIN A1C: Hgb A1c MFr Bld: 5.8 % (ref 4.6–6.5)

## 2022-08-15 LAB — TSH: TSH: 3 u[IU]/mL (ref 0.35–5.50)

## 2022-08-15 MED ORDER — VEOZAH 45 MG PO TABS
1.0000 | ORAL_TABLET | Freq: Every day | ORAL | 1 refills | Status: DC
Start: 2022-08-15 — End: 2022-08-19

## 2022-08-15 NOTE — Telephone Encounter (Signed)
Pharmacy Patient Advocate Encounter   Received notification that prior authorization for Veozah 45mg  tabs is required/requested.   PA submitted to [facility] via CoverMyMeds Key  # [id number] Status is pending

## 2022-08-15 NOTE — Assessment & Plan Note (Signed)
Active and affecting quality of life Has appointment with gynecologist next September Recommend to try Veozah 45 mg daily

## 2022-08-15 NOTE — Progress Notes (Signed)
Ariana Edwards 52 y.o.   Chief Complaint  Patient presents with   Annual Exam    Patient is having some menopause symptoms     HISTORY OF PRESENT ILLNESS: This is a 52 y.o. female here for annual exam Originally from [place].  Just returned from vacation in her country.  States that she gained a few pounds.  Did not exercise.  Ate and drank too much. Also complaining of menopausal symptoms including hot flashes Lack of libido, diminished vaginal lubrication and painful intercourse Has appointment to see gynecologist next September No other complaints or medical concerns today.  HPI   Prior to Admission medications   Medication Sig Start Date End Date Taking? Authorizing Provider  Fezolinetant (VEOZAH) 45 MG TABS Take 1 tablet (45 mg total) by mouth daily. 08/15/22  Yes SagardiaEilleen Kempf, MD  VITAMIN D PO Take by mouth daily.   Yes [provider]    Allergies  Allergen Reactions   Meloxicam Other (See Comments)    abd  pain    Sulfa Antibiotics Rash    Patient Active Problem List   Diagnosis Date Noted   Menopause 08/15/2022   Menopausal symptoms 08/15/2022   Fibroids 07/20/2014   Fibroid, uterine    DYSPEPSIA&OTHER SPEC DISORDERS FUNCTION STOMACH 06/23/2008    Past Medical History:  Diagnosis Date   Arthritis    left hand    Hyperlipidemia    Rosacea    Uterine fibroid     Past Surgical History:  Procedure Laterality Date   BREAST BIOPSY     BREAST SURGERY     x 2 for Fibroids    FRACTURE SURGERY     HAND SURGERY     IR GENERIC HISTORICAL  05/04/2014   IR RADIOLOGIST EVAL & MGMT 05/04/2014 Jolaine Click, MD [facility]   IR GENERIC HISTORICAL  03/07/2015   IR RADIOLOGIST EVAL & MGMT 03/07/2015 [facility]- for uterine fibroids    UTERINE FIBROID EMBOLIZATION      Social History   Socioeconomic History   Marital status: Married    Spouse name: Not on file   Number of children: 0   Years of education: Not on file    Highest education level: Not on file  Occupational History   Not on file  Tobacco Use   Smoking status: Never   Smokeless tobacco: Never  Vaping Use   Vaping status: Never Used  Substance and Sexual Activity   Alcohol use: Yes    Alcohol/week: 2.0 standard drinks of alcohol    Types: 2 Standard drinks or equivalent per week    Comment: socially   Drug use: No   Sexual activity: Yes    Partners: Male    Birth control/protection: None  Other Topics Concern   Not on file  Social History Narrative   Not on file   Social Determinants of Health   Financial Resource Strain: Not on file  Food Insecurity: Not on file  Transportation Needs: Not on file  Physical Activity: Not on file  Stress: Not on file  Social Connections: Not on file  Intimate Partner Violence: Not on file    Family History  Problem Relation Age of Onset   Hypertension Mother    Liver cancer Mother    Stomach cancer Mother    Thyroid disease Sister    Hypertension Maternal Grandmother    Heart attack Maternal Grandmother    Cancer Maternal Grandfather    Prostate cancer  Maternal Grandfather      Review of Systems  Constitutional: Negative.  Negative for chills and fever.  HENT: Negative.  Negative for congestion and sore throat.   Respiratory: Negative.  Negative for cough and shortness of breath.   Cardiovascular: Negative.  Negative for chest pain and palpitations.  Gastrointestinal:  Negative for abdominal pain, nausea and vomiting.  Genitourinary:  Negative for dysuria and hematuria.       Lack of libido Poor vaginal lubrication Painful intercourse Hot flashes  Musculoskeletal: Negative.   Skin: Negative.  Negative for rash.  Neurological: Negative.  Negative for dizziness and headaches.  All other systems reviewed and are negative.   Vitals:   08/15/22 0921  BP: 112/74  Pulse: 81  Temp: 97.8 F (36.6 C)  SpO2: 99%    Physical Exam Vitals reviewed.  Constitutional:       Appearance: Normal appearance.  HENT:     Head: Normocephalic.     Right Ear: Tympanic membrane, ear canal and external ear normal.     Left Ear: Tympanic membrane, ear canal and external ear normal.     Mouth/Throat:     Mouth: Mucous membranes are moist.     Pharynx: Oropharynx is clear.  Eyes:     Extraocular Movements: Extraocular movements intact.     Conjunctiva/sclera: Conjunctivae normal.     Pupils: Pupils are equal, round, and reactive to light.  Cardiovascular:     Rate and Rhythm: Normal rate and regular rhythm.     Pulses: Normal pulses.     Heart sounds: Normal heart sounds.  Pulmonary:     Effort: Pulmonary effort is normal.     Breath sounds: Normal breath sounds.  Abdominal:     Palpations: Abdomen is soft.     Tenderness: There is no abdominal tenderness.  Musculoskeletal:     Cervical back: No tenderness.  Lymphadenopathy:     Cervical: No cervical adenopathy.  Skin:    General: Skin is warm and dry.     Capillary Refill: Capillary refill takes less than 2 seconds.  Neurological:     General: No focal deficit present.     Mental Status: She is alert and oriented to person, place, and time.  Psychiatric:        Mood and Affect: Mood normal.        Behavior: Behavior normal.      ASSESSMENT & PLAN: Problem List Items Addressed This Visit       Other   Menopausal symptoms    Active and affecting quality of life Has appointment with gynecologist next September Recommend to try Veozah 45 mg daily      Relevant Medications   Fezolinetant (VEOZAH) 45 MG TABS   Other Visit Diagnoses     Encounter for general adult medical examination with abnormal findings    -  Primary   Relevant Orders   CBC with Differential   Comprehensive metabolic panel   Hemoglobin A1c   Lipid panel   TSH   Screening for deficiency anemia       Relevant Orders   CBC with Differential   Screening for lipoid disorders       Relevant Orders   Lipid panel   Screening  for endocrine, metabolic and immunity disorder       Relevant Orders   Comprehensive metabolic panel   Hemoglobin A1c   TSH      Modifiable risk factors discussed with patient. Anticipatory guidance according to  age provided. The following topics were also discussed: Social Determinants of Health Smoking.  Non-smoker Diet and nutrition Benefits of exercise Cancer screening and review of most recent mammogram and colonoscopy reports Vaccinations reviewed and recommendations Cardiovascular risk assessment and need for blood work The 10-year ASCVD risk score (Arnett DK, et al., 2019) is: 1.3%   Values used to calculate the score:     Age: 23 years     Sex: Female     Is Non-Hispanic African American: No     Diabetic: No     Tobacco smoker: No     Systolic Blood Pressure: 112 mmHg     Is BP treated: No     HDL Cholesterol: 69.8 mg/dL     Total Cholesterol: 262 mg/dL Symptoms of menopause and nonhormonal treatment.  Need to follow-up with gynecologist. Mental health including depression and anxiety Fall and accident prevention  Patient Instructions  Mantenimiento de la salud en las mujeres Health Maintenance, Female Adoptar un estilo de vida saludable y recibir atencin preventiva son importantes para promover la salud y Counsellor. Consulte al mdico sobre: El esquema adecuado para hacerse pruebas y exmenes peridicos. Cosas que puede hacer por su cuenta para prevenir enfermedades y [place] sano. Qu debo saber sobre la dieta, el peso y el ejercicio? Consuma una dieta saludable  Consuma una dieta que incluya muchas verduras, frutas, productos lcteos con bajo contenido de [place] y Associate Professor. No consuma muchos alimentos ricos en grasas slidas, azcares agregados o sodio. Mantenga un peso saludable El ndice de masa muscular [facility]) se [place] para identificar problemas de [place]. Proporciona una estimacin de la grasa corporal basndose en el peso y la altura. Su  mdico puede ayudarle a Engineer, site IMC y a Personnel officer o Pharmacologist un peso saludable. Haga ejercicio con regularidad Haga ejercicio con regularidad. Esta es una de las prcticas ms importantes que puede hacer por su salud. La [employer] de los adultos deben seguir estas pautas: Education officer, environmental, al menos, 150 minutos de actividad fsica por semana. El ejercicio debe aumentar la frecuencia cardaca y Media planner transpirar (ejercicio de intensidad moderada). Hacer ejercicios de fortalecimiento por lo [employer] por semana. Agregue esto a su plan de ejercicio de intensidad moderada. Pase menos tiempo sentada. Incluso la actividad fsica ligera puede ser beneficiosa. Controle sus niveles de colesterol y lpidos en la sangre Comience a realizarse anlisis de lpidos y Oncologist en la sangre a los 20 aos y luego reptalos cada 5 aos. Hgase controlar los niveles de colesterol con mayor frecuencia si: Sus niveles de lpidos y colesterol son altos. Es mayor de 40 aos. Presenta un alto riesgo de padecer enfermedades cardacas. Qu debo saber sobre las pruebas de deteccin del cncer? Segn su historia clnica y sus antecedentes familiares, es posible que deba realizarse pruebas de deteccin del cncer en diferentes edades. Esto puede incluir pruebas de deteccin de lo siguiente: Cncer de mama. Cncer de cuello uterino. Cncer colorrectal. Cncer de piel. Cncer de pulmn. Qu debo saber sobre la enfermedad cardaca, la diabetes y la hipertensin arterial? Presin arterial y enfermedad cardaca La hipertensin arterial causa enfermedades cardacas y [place] el riesgo de accidente cerebrovascular. Es ms probable que esto se manifieste en las personas que tienen lecturas de presin arterial alta o tienen sobrepeso. Hgase controlar la presin arterial: Cada 3 a 5 aos si tiene entre 18 y 53 aos. Todos los aos si es mayor de 40 aos. Diabetes Realcese exmenes de deteccin de la diabetes con  regularidad.  Este anlisis revisa el nivel de azcar en la sangre en [place]. Hgase las pruebas de deteccin: Cada tres aos despus de los 40 aos de edad si tiene un peso normal y un bajo riesgo de padecer diabetes. Con ms frecuencia y a partir de [place] edad inferior si tiene sobrepeso o un alto riesgo de padecer diabetes. Qu debo saber sobre la prevencin de infecciones? Hepatitis B Si tiene un riesgo ms alto de contraer hepatitis B, debe someterse a un examen de deteccin de este virus. Hable con el mdico para averiguar si tiene riesgo de contraer la infeccin por hepatitis B. Hepatitis C Se recomienda el anlisis a: [employer] 1945 y 1965. Todas las personas que tengan un riesgo de haber contrado hepatitis C. Enfermedades de transmisin sexual (ETS) Hgase las pruebas de Airline pilot de ITS, incluidas la gonorrea y la clamidia, si: Es sexualmente activa y es menor de [address]. Es mayor de [address], y Public affairs consultant informa que corre riesgo de tener este tipo de infecciones. La actividad sexual ha cambiado desde que le hicieron la ltima prueba de deteccin y tiene un riesgo mayor de Warehouse manager clamidia o Copy. Pregntele al mdico si usted tiene riesgo. Pregntele al mdico si usted tiene un alto riesgo de Primary school teacher VIH. El mdico tambin puede recomendarle un medicamento recetado para ayudar a evitar la infeccin por el VIH. Si elige tomar medicamentos para prevenir el VIH, primero debe [employer] de deteccin del VIH. Luego debe hacerse anlisis cada 3 meses mientras est tomando los medicamentos. Embarazo Si est por dejar de Armed forces training and education officer (fase premenopusica) y usted puede quedar [place], busque asesoramiento antes de [place]. [place] de 400 a 800 microgramos (mcg) de cido [employer] si [place]. Pida mtodos de control de la natalidad (anticonceptivos) si desea evitar un embarazo no deseado. Osteoporosis y [place] La osteoporosis es  una enfermedad en la que los huesos pierden los minerales y la fuerza por el avance de la edad. El resultado pueden ser fracturas en los [place]. Si tiene 65 aos o ms, o si est en riesgo de sufrir osteoporosis y fracturas, pregunte a su mdico si debe: Hacerse pruebas de deteccin de prdida sea. Tomar un suplemento de calcio o de vitamina D para reducir el riesgo de fracturas. Recibir terapia de reemplazo hormonal (TRH) para tratar los sntomas de la menopausia. Siga estas indicaciones en su casa: Consumo de alcohol No beba alcohol si: Su mdico le indica no hacerlo. Est embarazada, puede estar embarazada o est tratando de [place]. Si bebe alcohol: Limite la cantidad que bebe a lo siguiente: De 0 a 1 bebida por da. Sepa cunta cantidad de alcohol hay en las bebidas que toma. En los [address], una medida equivale a una botella de cerveza de 12 oz (355 ml), un vaso de vino de 5 oz (148 ml) o un vaso de una bebida alcohlica de alta graduacin de 1 oz (44 ml). Estilo de vida No consuma ningn producto que contenga nicotina o tabaco. Estos productos incluyen cigarrillos, tabaco para Theatre manager y aparatos de vapeo, como los Administrator, Civil Service. Si necesita ayuda para dejar de consumir estos productos, consulte al mdico. No consuma drogas. No comparta agujas. Solicite ayuda a su mdico si necesita apoyo o informacin para abandonar las drogas. Indicaciones generales Realcese los estudios de rutina de [address], dentales y de Wellsite geologist. Mantngase al da con las vacunas. Infrmele a su mdico si: Se siente deprimida con frecuencia.  Alguna vez ha sido vctima de [place] o no se siente seguro en su casa. Resumen Adoptar un estilo de vida saludable y recibir atencin preventiva son importantes para promover la salud y Counsellor. Siga las instrucciones del mdico acerca de una dieta saludable, el ejercicio y la realizacin de pruebas o exmenes para Radiation protection practitioner. Siga las instrucciones del mdico con respecto al control del colesterol y la presin arterial. Esta informacin no tiene Theme park manager el consejo del mdico. Asegrese de hacerle al mdico cualquier pregunta que tenga. Document Revised: 06/29/2020 Document Reviewed: 06/29/2020 Elsevier Patient Education  2024 Elsevier Inc.       Edwina Barth, MD [facility]

## 2022-08-15 NOTE — Patient Instructions (Signed)

## 2022-08-16 NOTE — Telephone Encounter (Signed)
Pharmacy Patient Advocate Encounter  Received notification from [facility] that Prior Authorization for Ariana Edwards has been Your plan only covers this drug when you meet one of these options: A) You have tried other drugs your plan covers (preferred drugs), and they did not work well for you, or B) Your doctor gives [place] a medical reason you cannot take those other drugs. For your plan, you may need to try up to three preferred drugs. We have denied your request because you do not meet any of these conditions. We reviewed the information we had. Your request has been denied. Your doctor can send [place] any new or missing information for [place] to review. The preferred drugs for your plan are: estradiol, estradiolnorethindrone, CLIMARA PRO, COMBIPATCH, DUAVEE, PREMPHASE, PREMPRO.  PA #/Case ID/Reference #: [id number]   Please be advised we currently do not have a Pharmacist to review denials, therefore you will need to process appeals accordingly as needed. Thanks for your support at this time. Contact for appeals are as follows: Phone: [phone], Fax: [phone]

## 2022-08-19 ENCOUNTER — Other Ambulatory Visit: Payer: Self-pay | Admitting: Emergency Medicine

## 2022-08-19 DIAGNOSIS — N951 Menopausal and female climacteric states: Secondary | ICD-10-CM

## 2022-08-19 NOTE — Telephone Encounter (Signed)
Per pharmacy PER INSURANCE NOT COVERED. Need alternative...Raechel Chute

## 2022-08-19 NOTE — Telephone Encounter (Signed)
PA denied [employer] covers  The preferred drugs for your plan are: estradiol, estradiolnorethindrone, CLIMARA PRO, COMBIPATCH, DUAVEE, PREMPHASE, PREMPRO.   Please advise

## 2022-08-19 NOTE — Telephone Encounter (Signed)
Sent MD response back to pharmacy...Ariana Edwards

## 2022-08-19 NOTE — Telephone Encounter (Signed)
Let her GYN doctor make this decision then.  Thanks.

## 2022-08-19 NOTE — Telephone Encounter (Signed)
Will not be offering alternative.  GYN doctor has to decide.

## 2022-08-21 ENCOUNTER — Other Ambulatory Visit: Payer: Self-pay | Admitting: Emergency Medicine

## 2022-08-21 DIAGNOSIS — Z1231 Encounter for screening mammogram for malignant neoplasm of breast: Secondary | ICD-10-CM

## 2022-09-13 ENCOUNTER — Ambulatory Visit
Admission: RE | Admit: 2022-09-13 | Discharge: 2022-09-13 | Disposition: A | Discharge status: Home / Self Care | Payer: BC Managed Care – PPO | Source: Ambulatory Visit | Attending: Emergency Medicine | Admitting: Emergency Medicine

## 2022-09-13 DIAGNOSIS — Z1231 Encounter for screening mammogram for malignant neoplasm of breast: Secondary | ICD-10-CM

## 2022-09-13 NOTE — Telephone Encounter (Signed)
Spoke to patient to advise of Paula's note that she has no evidence of H Pylori or celiac. Also advised that if she is able to get records on whether or not her mother had stomach cancer, it would be extremely helpful. Advised otherwise, no need for screening endoscopy. Also advised if she develops abdominal pain, nausea, vomiting, weight loss or other alarm symptoms, that we are happy to see her back in office. Patient verbalizes understanding. She also states that she did get records regarding her mother translated into [place] and will send this through MyChart for Gunnar Fusi to review.

## 2022-09-25 NOTE — Telephone Encounter (Signed)
Agree with proceeding with EGD

## 2022-09-27 NOTE — Telephone Encounter (Signed)
Called patient and notified of Ariana Edwards recommendations. Patient agreed to the EGD procedure. Scheduled appt on 11/13/22 per Dr. Lavon Paganini, with the pre-visit scheduled on 11/05/22.

## 2022-10-16 ENCOUNTER — Ambulatory Visit (INDEPENDENT_AMBULATORY_CARE_PROVIDER_SITE_OTHER): Payer: BC Managed Care – PPO | Admitting: Obstetrics and Gynecology

## 2022-10-16 ENCOUNTER — Encounter: Payer: Self-pay | Admitting: Obstetrics and Gynecology

## 2022-10-16 VITALS — HR 83 | Ht 61.25 in | Wt 128.0 lb

## 2022-10-16 DIAGNOSIS — N9419 Other specified dyspareunia: Secondary | ICD-10-CM | POA: Diagnosis not present

## 2022-10-16 DIAGNOSIS — R102 Pelvic and perineal pain: Secondary | ICD-10-CM

## 2022-10-16 DIAGNOSIS — Z01419 Encounter for gynecological examination (general) (routine) without abnormal findings: Secondary | ICD-10-CM

## 2022-10-16 DIAGNOSIS — R350 Frequency of micturition: Secondary | ICD-10-CM | POA: Diagnosis not present

## 2022-10-16 DIAGNOSIS — N3000 Acute cystitis without hematuria: Secondary | ICD-10-CM

## 2022-10-16 DIAGNOSIS — R232 Flushing: Secondary | ICD-10-CM | POA: Diagnosis not present

## 2022-10-16 MED ORDER — FLUCONAZOLE 150 MG PO TABS
150.0000 mg | ORAL_TABLET | Freq: Once | ORAL | 0 refills | Status: AC
Start: 2022-10-16 — End: 2022-10-16

## 2022-10-16 MED ORDER — CEPHALEXIN 500 MG PO CAPS
500.0000 mg | ORAL_CAPSULE | Freq: Two times a day (BID) | ORAL | 0 refills | Status: AC
Start: 2022-10-16 — End: 2022-10-23

## 2022-10-16 MED ORDER — ESTRADIOL 0.0375 MG/24HR TD PTWK: 0.0375 mg | MEDICATED_PATCH | TRANSDERMAL | 12 refills | Status: DC

## 2022-10-16 MED ORDER — ESTRADIOL 0.1 MG/GM VA CREA: 1.0000 | TOPICAL_CREAM | Freq: Every day | VAGINAL | 12 refills | Status: DC

## 2022-10-16 NOTE — Progress Notes (Signed)
estra   52 y.o. y.o. female here for consult for the below. She denies any PM bleeding.  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     Component Value Date/Time   DIAGPAP  08/30/2020 1547    - Negative for intraepithelial lesion or malignancy (NILM)   ADEQPAP  08/30/2020 1547    Satisfactory for evaluation; transformation zone component PRESENT.    GYN HISTORY:    Component Value Date/Time   DIAGPAP  08/30/2020 1547    - Negative for intraepithelial lesion or malignancy (NILM)   ADEQPAP  08/30/2020 1547    Satisfactory for evaluation; transformation zone component PRESENT.    OB History  Gravida Para Term Preterm AB Living  0 0 0 0 0 0  SAB IAB Ectopic Multiple Live Births  0 0 0 0 0    Past Medical History:  Diagnosis Date   Arthritis    left hand    Hyperlipidemia    Rosacea    Uterine fibroid     Past Surgical History:  Procedure Laterality Date   BREAST BIOPSY     BREAST SURGERY     x 2 for Fibroids    FRACTURE SURGERY     HAND SURGERY     IR GENERIC HISTORICAL  05/04/2014   IR RADIOLOGIST EVAL & MGMT 05/04/2014 Jolaine Click, MD [facility]   IR GENERIC HISTORICAL  03/07/2015   IR RADIOLOGIST EVAL & MGMT 03/07/2015 [facility]- for uterine fibroids    UTERINE FIBROID EMBOLIZATION      Current Outpatient Medications on File Prior to Visit  Medication Sig Dispense Refill   VITAMIN D PO Take by mouth daily.     No current facility-administered medications on file prior to visit.    Social History   Socioeconomic History   Marital status: Married    Spouse name: Not on file   Number of children: 0   Years of education:  Not on file   Highest education level: Not on file  Occupational History   Not on file  Tobacco Use   Smoking status: Never   Smokeless tobacco: Never  Vaping Use   Vaping status: Never Used  Substance and Sexual Activity   Alcohol use: Yes    Alcohol/week: 2.0 standard drinks of alcohol    Types: 2 Standard drinks or equivalent per week    Comment: socially   Drug use: No   Sexual activity: Yes    Partners: Male    Birth control/protection: Post-menopausal  Other Topics Concern   Not on file  Social History Narrative   Not on file   Social Determinants of Health   Financial Resource Strain: Not on file  Food Insecurity: Not on file  Transportation Needs: Not on file  Physical Activity: Not on file  Stress: Not on file  Social Connections: Not on file  Intimate Partner Violence: Not on file    Family History  Problem Relation Age of Onset   Hypertension Mother    Liver cancer Mother    Stomach cancer Mother    Thyroid disease Sister    Hypertension Maternal Grandmother    Heart attack Maternal Grandmother  Cancer Maternal Grandfather    Prostate cancer Maternal Grandfather      Allergies  Allergen Reactions   Meloxicam Other (See Comments)    abd  pain    Sulfa Antibiotics Rash      Patient's last menstrual period was Patient's last menstrual period was 02/23/2021 (exact date)..          Sexually active: yes     Review of Systems Alls systems reviewed and are negative.     PE General appearance: alert, cooperative and appears stated age Head: Normocephalic, without obvious abnormality, atraumatic Neck: no adenopathy, supple, symmetrical, trachea midline and thyroid normal to inspection and palpation Lungs: clear to auscultation bilaterally Breasts: normal appearance, no masses or tenderness Heart: regular rate and rhythm Abdomen: soft, non-tender; bowel sounds normal; no masses,  no organomegaly Extremities: extremities normal, atraumatic, no  cyanosis or edema Skin: Skin color, texture, turgor normal. No rashes or lesions Lymph nodes: Cervical, supraclavicular, and axillary nodes normal. No abnormal inguinal nodes palpated Neurologic: Grossly normal     Pelvic: External genitalia:  no lesions              Urethra:  normal appearing urethra with no masses, tenderness or lesions              Bartholins and Skenes: normal                 Vagina: normal appearing vagina with normal color and discharge, no lesions.               Cervix: no lesions, no cervical motion tenderness               Bimanual Exam:  Uterus:  normal size, contour, position, consistency, mobility, non-tender              Adnexa: no mass, fullness, tenderness          Chaperone was present for exam.   A:         Well Woman GYN exam, UTI, dyspareunia, atrophic vaginitis                             P:        Pap smear not collected this year.  All normals. One done last year.  Can space out to 3-5 year testing             Encouraged annual mammogram screening             Colonoscopy UTD           TV US placed Pelvic pt referral Keflex for UTI sent. Diflucan after Counseled on the r/b/a/I of HRT use. To get a hormone panel.  Discussed that she will need progesterone to avoid unopposed estrogen on the endometrial lining and risk for endometrial cancer. Discussed lower risk for DVT and stroke with the patch. Side effects include risk of breast tenderness and spotting along with low risk of blood clots and stroke with uncontrolled hypertension. Counseled on the benefits to help improve the bone density during menopause.  To begin vaginal estrogen as well for atrophic vaginitis.  F/u with Dr. Edward Jolly in 1 month for f/u HRT and second opinion on alternatives, if needed   Earley Favor

## 2022-10-16 NOTE — Patient Instructions (Addendum)
The vaginal ultrasound order has been placed, all you need to do is call the scheduling desk at [phone] to make the appointment at one of our outpatient imaging centers at your earliest convenience. This is to evaluate your uterus and ovaries.  The report will go directly to epic after.   Physical therapy has been sent in for the pain with intercourse along with estrogen vaginal cream to use 3 times a week  Climara patch has been sent in.  This has a small dose of estrogen and progesterone.  This stays on for one week before a new one is placed  Keflex is an antibiotic for a bladder infection.  Take this twice daily for 7 days.  After you finish, please take diflucan to prevent a yeast infection.  Follow with Dr. Edward Jolly for HRT follow-up in one month  It was a pleasure meeting you today! Please let me know if there are any questions.

## 2022-10-17 NOTE — Addendum Note (Signed)
Addended by: Earley Favor on: 10/17/2022 08:46 AM   Modules accepted: Orders

## 2022-10-18 LAB — URINALYSIS, COMPLETE W/RFL CULTURE
Bilirubin Urine: NEGATIVE
Glucose, UA: NEGATIVE
Hyaline Cast: NONE SEEN /LPF
Ketones, ur: NEGATIVE
Nitrites, Initial: NEGATIVE
Protein, ur: NEGATIVE
Specific Gravity, Urine: 1.025 (ref 1.001–1.035)
pH: 6.5 (ref 5.0–8.0)

## 2022-10-18 LAB — URINE CULTURE
MICRO NUMBER:: 15451957
SPECIMEN QUALITY:: ADEQUATE

## 2022-10-18 LAB — SURESWAB® ADVANCED VAGINITIS PLUS,TMA
C. trachomatis RNA, TMA: NOT DETECTED
CANDIDA SPECIES: NOT DETECTED
Candida glabrata: NOT DETECTED
N. gonorrhoeae RNA, TMA: NOT DETECTED
SURESWAB(R) ADV BACTERIAL VAGINOSIS(BV),TMA: NEGATIVE
TRICHOMONAS VAGINALIS (TV),TMA: NOT DETECTED

## 2022-10-18 LAB — CULTURE INDICATED

## 2022-10-20 LAB — TESTOS,TOTAL,FREE AND SHBG (FEMALE)
Free Testosterone: 1.9 pg/mL (ref 0.1–6.4)
Sex Hormone Binding: 40.4 nmol/L (ref 17–124)
Testosterone, Total, LC-MS-MS: 14 ng/dL (ref 2–45)

## 2022-10-20 LAB — FOLLICLE STIMULATING HORMONE: FSH: 106.4 m[IU]/mL

## 2022-10-20 LAB — ESTRADIOL: Estradiol: 28 pg/mL

## 2022-10-21 ENCOUNTER — Encounter: Payer: Self-pay | Admitting: Obstetrics and Gynecology

## 2022-10-24 NOTE — Telephone Encounter (Signed)
Hi Arnita, The patch could increase the cholesterol slightly, so it is good to recheck it yearly.  I would put on in AM.   You can have on around your animal as well.  It will not affect the dog. Dr. Karma Greaser

## 2022-11-04 ENCOUNTER — Encounter: Payer: Self-pay | Admitting: Obstetrics and Gynecology

## 2022-11-04 NOTE — Telephone Encounter (Signed)
Spoke with patient.  See in office on 9/11, tx for UTI with Keflex. Completed Rx. Started Climara Pro patch and vaginal estradiol cream as prescribed, has been using for 3 weeks. Patient reports spotting and mild cramps for the past few days. Denies any other symptoms. Did not take fluconazole as prescribed.   OV scheduled for 10/2 at 4pm with Dr. Karma Greaser. Will send to Dr. Karma Greaser to review and our office will f/u with recommendations. Can cancel OV if determined not needed. Patient will hold off on using vaginal estrogen until further instructed. Patient verbalizes understanding.   Dr. Karma Greaser  -please review.

## 2022-11-05 NOTE — Telephone Encounter (Signed)
Call placed to patient, left detailed message, ok per dpr. Advised per Dr. Karma Greaser.   OV for 11/06/22 has been cancelled.  Call [facility] Radiology at [phone] to schedule PUS. Once PUS completed will schedule OV with EMB with Dr. Karma Greaser. Return call if any additional assistance needed or any additional questions.

## 2022-11-06 ENCOUNTER — Ambulatory Visit: Payer: BC Managed Care – PPO | Admitting: Obstetrics and Gynecology

## 2022-11-06 NOTE — Telephone Encounter (Signed)
Per EB: "She can stop the pathology from the EMB returns. Thank you Dr. Karma Greaser"

## 2022-11-13 ENCOUNTER — Encounter: Payer: BC Managed Care – PPO | Admitting: Gastroenterology

## 2022-11-13 ENCOUNTER — Ambulatory Visit (HOSPITAL_COMMUNITY)
Admission: RE | Admit: 2022-11-13 | Discharge: 2022-11-13 | Disposition: A | Discharge status: Home / Self Care | Payer: BC Managed Care – PPO | Source: Ambulatory Visit | Attending: Obstetrics and Gynecology | Admitting: Obstetrics and Gynecology

## 2022-11-13 DIAGNOSIS — Z01419 Encounter for gynecological examination (general) (routine) without abnormal findings: Secondary | ICD-10-CM | POA: Diagnosis present

## 2022-11-13 DIAGNOSIS — R102 Pelvic and perineal pain: Secondary | ICD-10-CM | POA: Insufficient documentation

## 2022-12-02 ENCOUNTER — Ambulatory Visit: Payer: BC Managed Care – PPO | Admitting: Obstetrics and Gynecology

## 2022-12-10 NOTE — Progress Notes (Unsigned)
GYNECOLOGY  VISIT   HPI: 52 y.o.   Married  Caucasian Hispanic female   G0P0000 with Patient's last menstrual period was 02/23/2021 (exact date).   here for: 1 mo med f/u-- pt started having bleeding with patch and estradiol cream. After the third week, pt noticed 3 days of spotting and menstrual cramps. This weekend had a little bit of spotting as well. The pt is still using the patch, but stopped the cream after the first spotting signs.  Prior to start the hormone treatment, patient had no energy, decreased interest in sex, some headache, and night sweats.  She was started on Estradiol patch and vaginal estrogen cream after visit in office 10/16/22.   Had brown and red spotting 2 - 3 weeks after taking the medications and she was advised to stop the cream.  Then developed transparent discharge and recurrent brown bleeding.  Overall she likes the results from the HRT.   FSH 106.4 and E2 28 on 10/16/22.  Hx uterine artery embolization in 2015.   Had pelvic US done 11/13/22: Narrative & Impression  CLINICAL DATA:  Initial evaluation for left lower quadrant pain.   EXAM: TRANSABDOMINAL AND TRANSVAGINAL ULTRASOUND OF PELVIS   TECHNIQUE: Both transabdominal and transvaginal ultrasound examinations of the pelvis were performed. Transabdominal technique was performed for global imaging of the pelvis including uterus, ovaries, adnexal regions, and pelvic cul-de-sac. It was necessary to proceed with endovaginal exam following the transabdominal exam to visualize the endometrium.   COMPARISON:  Prior ultrasound from 12/21/2020.   FINDINGS: Uterus   Measurements: 7.8 x 5.3 x 6.2 cm = volume: 131.2 mL. Uterus is anteverted with multiple fibroids seen as follows;   1. 3.9 x 3.8 x 4.6 cm peripherally calcified intramural fibroid present at the right posterior fundus. 2. 1.6 x 1.6 x 1.6 cm partially calcified intramural fibroid at the central uterine fundus. 3. 2.9 x 2.3 x 2.4 cm  partially calcified intramural fibroid present at the left lower uterine segment.   Few small nabothian cysts noted about the cervix, largest of which measures 1.6 x 1.8 x 1.9 cm.   Endometrium   Endometrial stripe is partially obscured due to overlying fibroids. Visualized portion measures up to 5 mm in thickness (series 1-4, image 339). No visible focal abnormality. Trace simple fluid noted within the endometrial cavity.   Right ovary   Measurements: 1.8 x 1.2 x 1.4 cm = volume: 1.6 mL. Normal appearance/no adnexal mass.   Left ovary   Not visualized.  No adnexal mass.   Other findings   No abnormal free fluid.   IMPRESSION: 1. Multiple partially calcified intramural fibroids as above, largest measuring up to 4.6 cm at the right posterior fundus. 2. Grossly normal endometrium, limited in assessment due to overlying fibroids. 3. Normal right ovary, with nonvisualization of the left ovary. No adnexal mass or free fluid.     Electronically Signed   By: Rise Mu M.D.   On: 12/07/2022 05:05     Asking about calcium. She was told to take vit D supplement.   Elevated ALT: 55 on 08/15/22.   GYNECOLOGIC HISTORY: Patient's last menstrual period was 02/23/2021 (exact date). Contraception:  PMP Menopausal hormone therapy:  estradiol patch and cream Pap:      Component Value Date/Time   DIAGPAP  08/30/2020 1547    - Negative for intraepithelial lesion or malignancy (NILM)   ADEQPAP  08/30/2020 1547    Satisfactory for evaluation; transformation zone component PRESENT.  Mammogram:  09/13/22 Breast Density Cat C, BI-RADS CAT 1 neg        OB History     Gravida  0   Para  0   Term  0   Preterm  0   AB  0   Living  0      SAB  0   IAB  0   Ectopic  0   Multiple  0   Live Births  0              Patient Active Problem List   Diagnosis Date Noted   Menopause 08/15/2022   Menopausal symptoms 08/15/2022   Fibroids 07/20/2014    Fibroid, uterine    DYSPEPSIA&OTHER SPEC DISORDERS FUNCTION STOMACH 06/23/2008    Past Medical History:  Diagnosis Date   Arthritis    left hand    Hyperlipidemia    Rosacea    Uterine fibroid     Past Surgical History:  Procedure Laterality Date   BREAST BIOPSY     BREAST SURGERY     x 2 for Fibroids    FRACTURE SURGERY     HAND SURGERY     IR GENERIC HISTORICAL  05/04/2014   IR RADIOLOGIST EVAL & MGMT 05/04/2014 Jolaine Click, MD [facility] INTERV RAD   IR GENERIC HISTORICAL  03/07/2015   IR RADIOLOGIST EVAL & MGMT 03/07/2015 [facility] INTERV RAD- for uterine fibroids    UTERINE FIBROID EMBOLIZATION      Current Outpatient Medications  Medication Sig Dispense Refill   estradiol (CLIMARA) 0.0375 mg/24hr patch Place 1 patch (0.0375 mg total) onto the skin once a week. 4 patch 12   progesterone (PROMETRIUM) 100 MG capsule Take 1 capsule (100 mg total) by mouth daily. 90 capsule 0   VITAMIN D PO Take by mouth daily.     estradiol (ESTRACE VAGINAL) 0.1 MG/GM vaginal cream Place 1/2 gram per vagina at bedtime 2 - 3 nights per week. 42.5 g 2   No current facility-administered medications for this visit.     ALLERGIES: Meloxicam and Sulfa antibiotics  Family History  Problem Relation Age of Onset   Hypertension Mother    Liver cancer Mother    Stomach cancer Mother    Thyroid disease Sister    Hypertension Maternal Grandmother    Heart attack Maternal Grandmother    Cancer Maternal Grandfather    Prostate cancer Maternal Grandfather     Social History   Socioeconomic History   Marital status: Married    Spouse name: Not on file   Number of children: 0   Years of education: Not on file   Highest education level: Not on file  Occupational History   Not on file  Tobacco Use   Smoking status: Never   Smokeless tobacco: Never  Vaping Use   Vaping status: Never Used  Substance and Sexual Activity   Alcohol use: Yes    Alcohol/week: 2.0 standard drinks of alcohol     Types: 2 Standard drinks or equivalent per week    Comment: socially   Drug use: No   Sexual activity: Yes    Partners: Male    Birth control/protection: Post-menopausal  Other Topics Concern   Not on file  Social History Narrative   Not on file   Social Determinants of Health   Financial Resource Strain: Not on file  Food Insecurity: Not on file  Transportation Needs: Not on file  Physical Activity: Not on file  Stress: Not  on file  Social Connections: Not on file  Intimate Partner Violence: Not on file    Review of Systems  All other systems reviewed and are negative.   PHYSICAL EXAMINATION:   BP 126/74 (BP Location: Right Arm, Patient Position: Sitting, Cuff Size: Normal)   Pulse 72   Ht 5' 1.25" (1.556 m)   Wt 128 lb (58.1 kg)   LMP 02/23/2021 (Exact Date)   SpO2 100%   BMI 23.99 kg/m     General appearance: alert, cooperative and appears stated age  Pelvic: External genitalia:  no lesions              Urethra:  normal appearing urethra with no masses, tenderness or lesions              Bartholins and Skenes: normal                 Vagina: normal appearing vagina with normal color and discharge, no lesions              Cervix: no lesions                Bimanual Exam:  Uterus:  normal size, contour, position, consistency, mobility, non-tender              Adnexa: no mass, fullness, tenderness          Chaperone was present for exam:  Warren Lacy, CMA  ASSESSMENT:  Postmenopausal bleeding.  ERT.  No current progesterone treatment.  Hx recent vaginal estradiol use.  Status post uterine artery embolization.  Calcified uterine fibroids.  Elevated ALT.  FH liver cancer in her mother.  Nutrition counseling.  PLAN:  HRT regimens discussed:  cyclic versus continuous.  I recommend she start daily Prometrium 100 mg po q hs.   Ok to restart vaginal estrogen therapy.  Discused WHI and use of HRT which can increase risk of PE, DVT, MI, stroke and breast cancer.   Return for endometrial biopsy.  Procedure and rationale explained.  Elevated ALT discussed and potential etiologies.  Calcium 1200 mg daily and vit D 60- 800 international units daily recommended.  Will recheck her LFTs and vit D level at her endometrial biopsy visit.   40 min  total time was spent for this patient encounter, including preparation, face-to-face counseling with the patient, coordination of care, and documentation of the encounter.  An After Visit Summary was provided to the patient.

## 2022-12-16 ENCOUNTER — Encounter: Payer: Self-pay | Admitting: Obstetrics and Gynecology

## 2022-12-24 ENCOUNTER — Ambulatory Visit: Payer: BC Managed Care – PPO | Admitting: Obstetrics and Gynecology

## 2022-12-24 ENCOUNTER — Encounter: Payer: Self-pay | Admitting: Obstetrics and Gynecology

## 2022-12-24 VITALS — BP 126/74 | HR 72 | Ht 61.25 in | Wt 128.0 lb

## 2022-12-24 DIAGNOSIS — N950 Postmenopausal bleeding: Secondary | ICD-10-CM | POA: Diagnosis not present

## 2022-12-24 DIAGNOSIS — R7401 Elevation of levels of liver transaminase levels: Secondary | ICD-10-CM

## 2022-12-24 DIAGNOSIS — Z79899 Other long term (current) drug therapy: Secondary | ICD-10-CM | POA: Diagnosis not present

## 2022-12-24 DIAGNOSIS — Z713 Dietary counseling and surveillance: Secondary | ICD-10-CM | POA: Diagnosis not present

## 2022-12-24 DIAGNOSIS — Z79890 Hormone replacement therapy (postmenopausal): Secondary | ICD-10-CM

## 2022-12-24 MED ORDER — PROGESTERONE MICRONIZED 100 MG PO CAPS: 100.0000 mg | ORAL_CAPSULE | Freq: Every day | ORAL | 0 refills | Status: DC

## 2022-12-24 MED ORDER — ESTRADIOL 0.1 MG/GM VA CREA: TOPICAL_CREAM | VAGINAL | 2 refills | Status: DC

## 2022-12-24 NOTE — Patient Instructions (Signed)
Calcium Content in Foods Calcium is the most abundant mineral in the body. Most of the body's calcium supply is stored in bones and teeth. Calcium helps many parts of the body function normally, including: Blood and blood vessels. Nerves. Hormones. Muscles. Bones and teeth. When your calcium stores are low, you may be at risk for low bone mass, bone loss, and broken bones (fractures). When you get enough calcium, it helps to support strong bones and teeth throughout your life. Calcium is especially important for: Children during growth spurts. Girls during adolescence. Women who are pregnant or breastfeeding. Women after their menstrual cycle stops (postmenopause). Women whose menstrual cycle has stopped due to anorexia nervosa or regular intense exercise. People who cannot eat or digest dairy products. Vegans. Recommended daily amounts of calcium: Women (ages 63 to 23): 1,000 mg per day. Women (ages 53 and older): 1,200 mg per day. Men (ages 58 to 55): 1,000 mg per day. Men (ages 48 and older): 1,200 mg per day. Women (ages 58 to 22): 1,300 mg per day. Men (ages 60 to 51): 1,300 mg per day. General information Eat foods that are high in calcium. Try to get most of your calcium from food. Some people may benefit from taking calcium supplements. Check with your health care provider or diet and nutrition specialist (dietitian) before starting any calcium supplements. Calcium supplements may interact with certain medicines. Too much calcium may cause other health problems, such as constipation and kidney stones. For the body to absorb calcium, it needs vitamin D. Sources of vitamin D include: Skin exposure to direct sunlight. Foods, such as egg yolks, liver, mushrooms, saltwater fish, and fortified milk. Vitamin D supplements. Check with your health care provider or dietitian before starting any vitamin D supplements. What foods are high in calcium?  Foods that are high in calcium contain  more than 100 milligrams per serving. Fruits Fortified orange juice or other fruit juice, 300 mg per 8 oz serving. Vegetables Collard greens, 360 mg per 8 oz serving. Kale, 100 mg per 8 oz serving. Bok choy, 160 mg per 8 oz serving. Grains Fortified ready-to-eat cereals, 100 to 1,000 mg per 8 oz serving. Fortified frozen waffles, 200 mg in 2 waffles. Oatmeal, 140 mg in 1 cup. Meats and other proteins Sardines, canned with bones, 325 mg per 3 oz serving. Salmon, canned with bones, 180 mg per 3 oz serving. Canned shrimp, 125 mg per 3 oz serving. Baked beans, 160 mg per 4 oz serving. Tofu, firm, made with calcium sulfate, 253 mg per 4 oz serving. Dairy Yogurt, plain, low-fat, 310 mg per 6 oz serving. Nonfat milk, 300 mg per 8 oz serving. American cheese, 195 mg per 1 oz serving. Cheddar cheese, 205 mg per 1 oz serving. Cottage cheese 2%, 105 mg per 4 oz serving. Fortified soy, rice, or almond milk, 300 mg per 8 oz serving. Mozzarella, part skim, 210 mg per 1 oz serving. The items listed above may not be a complete list of foods high in calcium. Actual amounts of calcium may be different depending on processing. Contact a dietitian for more information. What foods are lower in calcium? Foods that are lower in calcium contain 50 mg or less per serving. Fruits Apple, about 6 mg. Banana, about 12 mg. Vegetables Lettuce, 19 mg per 2 oz serving. Tomato, about 11 mg. Grains Rice, 4 mg per 6 oz serving. Boiled potatoes, 14 mg per 8 oz serving. White bread, 6 mg per slice. Meats and other proteins  Egg, 27 mg per 2 oz serving. Red meat, 7 mg per 4 oz serving. Chicken, 17 mg per 4 oz serving. Fish, cod, or trout, 20 mg per 4 oz serving. Dairy Cream cheese, regular, 14 mg per 1 Tbsp serving. Brie cheese, 50 mg per 1 oz serving. Parmesan cheese, 70 mg per 1 Tbsp serving. The items listed above may not be a complete list of foods lower in calcium. Actual amounts of calcium may be  different depending on processing. Contact a dietitian for more information. Summary Calcium is an important mineral in the body because it affects many functions. Getting enough calcium helps support strong bones and teeth throughout your life. Try to get most of your calcium from food. Calcium supplements may interact with certain medicines. Check with your health care provider or dietitian before starting any calcium supplements. This information is not intended to replace advice given to you by your health care provider. Make sure you discuss any questions you have with your health care provider. Document Revised: 05/19/2019 Document Reviewed: 05/19/2019 Elsevier Patient Education  2024 [employer].

## 2022-12-25 ENCOUNTER — Telehealth: Payer: Self-pay | Admitting: Obstetrics and Gynecology

## 2022-12-25 NOTE — Telephone Encounter (Signed)
Please schedule endometrial biopsy with me and lab appointment.

## 2022-12-26 ENCOUNTER — Encounter: Payer: Self-pay | Admitting: Obstetrics and Gynecology

## 2022-12-26 NOTE — Telephone Encounter (Signed)
Pt scheduled for 12/21/2022.  Encounter closed.

## 2022-12-26 NOTE — Telephone Encounter (Signed)
Patient called. I explained that she should be on progesterone.  I believe what happened what the pop up for climara pro came up with climara with just estrogen and will be more closely watched when ordering.   Discussed low risk for endometrial cancer with 2 months of estrogen and [place] lining in normal range 5mm in November, However, I still encouraged patient to do an EMB and that it should be documented and completed.  Per last note with visit she was suppose to follow in one month with Dr. Edward Jolly for second opinion on hormones and to make changes and this was not made. She did send messages about the spotting. Expressed thank you to the patient for letting me know. Dr. Karma Greaser

## 2022-12-26 NOTE — Telephone Encounter (Signed)
Pt also LVM in triage line re: this subject. Desires to speak directly with EB about this.

## 2022-12-26 NOTE — Telephone Encounter (Signed)
Msg sent to appt/[facility].

## 2022-12-27 NOTE — Telephone Encounter (Signed)
EMB on 12/31/22.

## 2022-12-30 NOTE — Progress Notes (Unsigned)
GYNECOLOGY  VISIT   HPI: 52 y.o.   Married  Caucasian Hispanic female   G0P0000 with Patient's last menstrual period was 02/23/2021 (exact date).   here for: endo bx.   Has not had spotting since starting progesterone to add to her estrogen therapy.   She started her ERT due to lack of energy and decreased interest in sex.  Intercourse is painful.   US showed EMS up to 5 mm and difficulty to completely visualize due to fibroids.  Some trace fluid in the endometrial canal.   GYNECOLOGIC HISTORY: Patient's last menstrual period was 02/23/2021 (exact date). Contraception:  PMP Menopausal hormone therapy:  estradiol patch and cream Last 2 paps:  08/30/20 neg History of abnormal Pap or positive HPV:  no Mammogram:  09/13/22 Breast Density Cat C, BI-RADS CAT 1 neg         OB History     Gravida  0   Para  0   Term  0   Preterm  0   AB  0   Living  0      SAB  0   IAB  0   Ectopic  0   Multiple  0   Live Births  0              Patient Active Problem List   Diagnosis Date Noted   Menopause 08/15/2022   Menopausal symptoms 08/15/2022   Fibroids 07/20/2014   Fibroid, uterine    DYSPEPSIA&OTHER SPEC DISORDERS FUNCTION STOMACH 06/23/2008    Past Medical History:  Diagnosis Date   Arthritis    left hand    Hyperlipidemia    Rosacea    Uterine fibroid     Past Surgical History:  Procedure Laterality Date   BREAST BIOPSY     BREAST SURGERY     x 2 for Fibroids    FRACTURE SURGERY     HAND SURGERY     IR GENERIC HISTORICAL  05/04/2014   IR RADIOLOGIST EVAL & MGMT 05/04/2014 Jolaine Click, MD [facility] INTER[facility] RAD   IR GENERIC HISTORICAL  03/07/2015   IR RADIOLOGIST EVAL & MGMT 03/07/2015 [facility] INTER[facility] RAD- for uterine fibroids    UTERINE FIBROID EMBOLIZATION      Current Outpatient Medications  Medication Sig Dispense Refill   estradiol (CLIMARA) 0.0375 mg/24hr patch Place 1 patch (0.0375 mg total) onto the skin once a week. 4 patch 12   estradiol  (ESTRACE VAGINAL) 0.1 MG/GM vaginal cream Place 1/2 gram per vagina at bedtime 2 - 3 nights per week. 42.5 g 2   progesterone (PROMETRIUM) 100 MG capsule Take 1 capsule (100 mg total) by mouth daily. 90 capsule 0   VITAMIN D PO Take by mouth daily.     No current facility-administered medications for this visit.     ALLERGIES: Meloxicam and Sulfa antibiotics  Family History  Problem Relation Age of Onset   Hypertension Mother    Liver cancer Mother    Stomach cancer Mother    Thyroid disease Sister    Hypertension Maternal Grandmother    Heart attack Maternal Grandmother    Cancer Maternal Grandfather    Prostate cancer Maternal Grandfather     Social History   Socioeconomic History   Marital status: Married    Spouse name: Not on file   Number of children: 0   Years of education: Not on file   Highest education level: Not on file  Occupational History   Not on  file  Tobacco Use   Smoking status: Never   Smokeless tobacco: Never  Vaping Use   Vaping status: Never Used  Substance and Sexual Activity   Alcohol use: Yes    Alcohol/week: 2.0 standard drinks of alcohol    Types: 2 Standard drinks or equivalent per week    Comment: socially   Drug use: No   Sexual activity: Yes    Partners: Male    Birth control/protection: Post-menopausal  Other Topics Concern   Not on file  Social History Narrative   Not on file   Social Determinants of Health   Financial Resource Strain: Not on file  Food Insecurity: Not on file  Transportation Needs: Not on file  Physical Activity: Not on file  Stress: Not on file  Social Connections: Not on file  Intimate Partner Violence: Not on file    Review of Systems  All other systems reviewed and are negative.   PHYSICAL EXAMINATION:   BP 126/84 (BP Location: Right Arm, Patient Position: Sitting, Cuff Size: Normal)   Pulse 93   Ht 5' 1.25" (1.556 m)   Wt 128 lb (58.1 kg)   LMP 02/23/2021 (Exact Date)   SpO2 99%   BMI 23.99  kg/m     General appearance: alert, cooperative and appears stated age   EMB: Consent for procedure. Small amount of dark blood at os. Sterile prep with Hibiclens Tenaculum to anterior cervical lip. Pipelle passed to    6      cm twice.   Tissue to pathology.  Minimal EBL. No complications.   Chaperone was present for exam:  Warren Lacy, CMA  ASSESSMENT:  Postmenopausal bleeding.  Was on unopposed ERT, now on HRT.  Decreased libido. Hx low vit D.  Elevated ALT.  PLAN:  FU EMB. Continue HRT.  OK to start vaginal estrogen cream after bleeding stops.  She declines starting testosterone treatment.  Check Vit D and LFTs today. FU in 3 months.

## 2022-12-31 ENCOUNTER — Encounter: Payer: Self-pay | Admitting: Obstetrics and Gynecology

## 2022-12-31 ENCOUNTER — Other Ambulatory Visit (HOSPITAL_COMMUNITY)
Admission: RE | Admit: 2022-12-31 | Discharge: 2022-12-31 | Disposition: A | Discharge status: Home / Self Care | Payer: BC Managed Care – PPO | Source: Ambulatory Visit | Attending: Obstetrics and Gynecology | Admitting: Obstetrics and Gynecology

## 2022-12-31 ENCOUNTER — Ambulatory Visit: Payer: BC Managed Care – PPO | Admitting: Obstetrics and Gynecology

## 2022-12-31 VITALS — BP 126/84 | HR 93 | Ht 61.25 in | Wt 128.0 lb

## 2022-12-31 DIAGNOSIS — Z713 Dietary counseling and surveillance: Secondary | ICD-10-CM | POA: Diagnosis not present

## 2022-12-31 DIAGNOSIS — N950 Postmenopausal bleeding: Secondary | ICD-10-CM

## 2022-12-31 DIAGNOSIS — R7401 Elevation of levels of liver transaminase levels: Secondary | ICD-10-CM

## 2022-12-31 NOTE — Patient Instructions (Signed)
Endometrial Biopsy  An endometrial biopsy is a procedure where a tissue sample is removed from the lining of the uterus. This lining is called the endometrium. The tissue sample is then sent to a lab for testing. You may have this type of biopsy to check for: Cancer. Infection. Growths called polyps. Uterine bleeding that can't be explained. Tell a health care provider about: Any allergies you have. All medicines you're taking including vitamins, herbs, eye drops, creams, and over-the-counter medicines. Any problems you or family members have had with anesthesia. Any bleeding problems you have. Any surgeries you have had. Any medical problems you have. Whether you're pregnant or may be pregnant. What are the risks? Your health care provider will talk with you about risks. These may include: Bleeding. Infection. Allergic reactions to medicines. Damage to the wall of the uterus. This is rare. What happens before the procedure? Keep track of your period. You may need to have this biopsy when you're not having your period. Ask your provider about: Changing or stopping your regular medicines. These include any diabetes medicines or blood thinners you take. Taking medicines such as aspirin and ibuprofen. These medicines can thin your blood. Do not take them unless your provider tells you to. Taking over-the-counter medicines, vitamins, herbs, and supplements. Bring a pad with you. You may need to wear one after the biopsy. Plan to have a responsible adult take you home from the hospital or clinic. You won't be allowed to drive. What happens during the procedure? A tool will be put into your vagina to hold it open. This helps your provider see the cervix. The cervix is the lowest part of the uterus. Your cervix will be cleaned with a solution that kills germs. You will be given anesthesia. This keeps you from feeling pain. It will numb your cervix. A tool called forceps will be used to  hold your cervix steady. A thin tool called a uterine sound will be put through your cervix. It will be used to: Find the length of your uterus. Find where to take the sample from. A soft tube called a catheter will be put into your uterus. The catheter will remove a tissue sample. The tube and tools will be removed. The sample will be sent to a lab for testing. The procedure may vary among providers and hospitals. What happens after the procedure? Your blood pressure, heart rate, breathing rate, and blood oxygen level will be monitored until you leave the hospital or clinic. It's up to you to get the results of your procedure. Ask your provider, or the department that is doing the procedure, when your results will be ready. This information is not intended to replace advice given to you by your health care provider. Make sure you discuss any questions you have with your health care provider. Document Revised: 04/02/2022 Document Reviewed: 04/02/2022 Elsevier Patient Education  2024 [employer].

## 2023-01-01 LAB — SURGICAL PATHOLOGY

## 2023-01-01 LAB — HEPATIC FUNCTION PANEL
AG Ratio: 1.8 (calc) (ref 1.0–2.5)
ALT: 9 U/L (ref 6–29)
AST: 13 U/L (ref 10–35)
Albumin: 4.3 g/dL (ref 3.6–5.1)
Alkaline phosphatase (APISO): 82 U/L (ref 37–153)
Bilirubin, Direct: 0.1 mg/dL (ref 0.0–0.2)
Globulin: 2.4 g/dL (ref 1.9–3.7)
Indirect Bilirubin: 0.3 mg/dL (ref 0.2–1.2)
Total Bilirubin: 0.4 mg/dL (ref 0.2–1.2)
Total Protein: 6.7 g/dL (ref 6.1–8.1)

## 2023-01-01 LAB — VITAMIN D 25 HYDROXY (VIT D DEFICIENCY, FRACTURES): Vit D, 25-Hydroxy: 36 ng/mL (ref 30–100)

## 2023-02-20 ENCOUNTER — Other Ambulatory Visit: Payer: Self-pay | Admitting: Obstetrics and Gynecology

## 2023-02-20 ENCOUNTER — Encounter: Payer: Self-pay | Admitting: Obstetrics and Gynecology

## 2023-02-20 MED ORDER — PROGESTERONE 200 MG PO CAPS: ORAL_CAPSULE | ORAL | 1 refills | Status: DC

## 2023-02-20 NOTE — Telephone Encounter (Signed)
The pt also LVM in triage line about this.

## 2023-02-21 NOTE — Telephone Encounter (Signed)
Pt also just LVM in triage line regarding this asking if she should discontinue using the estrogen patch and desiring a response asap.

## 2023-02-21 NOTE — Telephone Encounter (Signed)
Spoke w/ the pt and she reported that she is not having an actual flow like having to soak through a pad/tampon but every time she goes to the restroom, she sees like a dark red discharge and feels as if she can't stop cleaning enough.   Also c/o her pain/discomfort level being at a 7/10 and is worried.   Please advise.

## 2023-03-19 NOTE — Progress Notes (Unsigned)
 GYNECOLOGY  VISIT   HPI: 53 y.o.   Married  Caucasian Hispanic female   G0P0000 with Patient's last menstrual period was 02/23/2021 (exact date).   here for: HRT follow up.     Started on unopposed estrogen inadvertently for vaginal dryness and pain after her routine visit in September.  She developed postmenopausal bleeding that lead to evaluation.   Pelvic US 11/13/22 showed EMS 5 mm with trace fluid in the endometrial canal.  Multiple intramural fibroids were noted.  Right ovary normal and left ovary not visualized.   EMB 12/31/22 showed benign disordered proliferative endometrium.  She had an episode of bleeding, cramping, and breast tenderness around 02/19/23.  I increased her Prometrium to 200 mg.    No problems for the last month on her HRT. No spotting, cramping, or breast tenderness.   No hot flashes unless she drinks hot coffee.   No night sweats.   Has a headache once in a while.   She is losing hair more since starting the hormone therapy.    Not using vaginal estrogen cream.  States she haas enough estrogen with the patch.   Has a dermatologist at [facility].   States she is losing weight.  Now 117 pounds.  Is changing her diet.   GYNECOLOGIC HISTORY: Patient's last menstrual period was 02/23/2021 (exact date). Contraception:  PMP Menopausal hormone therapy:  climara, estrace Last 2 paps:  08/30/20 neg History of abnormal Pap or positive HPV:  no Mammogram:  09/13/22 Breast Density Cat C, BI-RADS CAT 1 neg        OB History     Gravida  0   Para  0   Term  0   Preterm  0   AB  0   Living  0      SAB  0   IAB  0   Ectopic  0   Multiple  0   Live Births  0              Patient Active Problem List   Diagnosis Date Noted   Menopause 08/15/2022   Menopausal symptoms 08/15/2022   Fibroids 07/20/2014   Fibroid, uterine    DYSPEPSIA&OTHER SPEC DISORDERS FUNCTION STOMACH 06/23/2008    Past Medical History:  Diagnosis Date    Arthritis    left hand    Hyperlipidemia    Rosacea    Uterine fibroid     Past Surgical History:  Procedure Laterality Date   BREAST BIOPSY     BREAST SURGERY     x 2 for Fibroids    FRACTURE SURGERY     HAND SURGERY     IR GENERIC HISTORICAL  05/04/2014   IR RADIOLOGIST EVAL & MGMT 05/04/2014 Jolaine Click, MD [facility]   IR GENERIC HISTORICAL  03/07/2015   IR RADIOLOGIST EVAL & MGMT 03/07/2015 [facility]- for uterine fibroids    UTERINE FIBROID EMBOLIZATION      Current Outpatient Medications  Medication Sig Dispense Refill   estradiol (CLIMARA) 0.0375 mg/24hr patch Place 1 patch (0.0375 mg total) onto the skin once a week. 4 patch 12   progesterone (PROMETRIUM) 200 MG capsule Take one capsule (200 mg) by mouth at bedtime. 30 capsule 1   VITAMIN D PO Take by mouth daily.     estradiol (ESTRACE VAGINAL) 0.1 MG/GM vaginal cream Place 1/2 gram per vagina at bedtime 2 - 3 nights per week. (Patient not taking: Reported on 04/02/2023) 42.5  g 2   No current facility-administered medications for this visit.     ALLERGIES: Meloxicam and Sulfa antibiotics  Family History  Problem Relation Age of Onset   Hypertension Mother    Liver cancer Mother    Stomach cancer Mother    Thyroid disease Sister    Hypertension Maternal Grandmother    Heart attack Maternal Grandmother    Cancer Maternal Grandfather    Prostate cancer Maternal Grandfather     Social History   Socioeconomic History   Marital status: Married    Spouse name: Not on file   Number of children: 0   Years of education: Not on file   Highest education level: Not on file  Occupational History   Not on file  Tobacco Use   Smoking status: Never   Smokeless tobacco: Never  Vaping Use   Vaping status: Never Used  Substance and Sexual Activity   Alcohol use: Yes    Alcohol/week: 2.0 standard drinks of alcohol    Types: 2 Standard drinks or equivalent per week    Comment: socially   Drug use: No    Sexual activity: Yes    Partners: Male    Birth control/protection: Post-menopausal  Other Topics Concern   Not on file  Social History Narrative   Not on file   Social Drivers of Health   Financial Resource Strain: Not on file  Food Insecurity: Not on file  Transportation Needs: Not on file  Physical Activity: Not on file  Stress: Not on file  Social Connections: Not on file  Intimate Partner Violence: Not on file    Review of Systems  All other systems reviewed and are negative.   PHYSICAL EXAMINATION:   BP 116/72 (BP Location: Left Arm, Patient Position: Sitting, Cuff Size: Small)   Pulse 86   Ht 5' 1.25" (1.556 m)   Wt 117 lb (53.1 kg)   LMP 02/23/2021 (Exact Date)   SpO2 98%   BMI 21.93 kg/m     General appearance: alert, cooperative and appears stated age  ASSESSMENT:  HRT.  Doing well.   PLAN:  Continue Climara 0.0375 mg twice weekly and Prometrium 200 mg nightly.  Discused WHI and use of HRT which can increase risk of PE, DVT, MI, stroke and breast cancer.  Benefits of HRT are to improve quality of life, control hot flashes and night sweats, improve sexual functioning, reduce risk of colon cancer, and reduce risk of osteoporosis.  Call for any future vaginal bleeding.  Fu for annual exam and prn.   35 min  total time was spent for this patient encounter, including preparation, face-to-face counseling with the patient, coordination of care, and documentation of the encounter.

## 2023-04-02 ENCOUNTER — Encounter: Payer: Self-pay | Admitting: Obstetrics and Gynecology

## 2023-04-02 ENCOUNTER — Ambulatory Visit (INDEPENDENT_AMBULATORY_CARE_PROVIDER_SITE_OTHER): Payer: 59 | Admitting: Obstetrics and Gynecology

## 2023-04-02 DIAGNOSIS — Z79890 Hormone replacement therapy (postmenopausal): Secondary | ICD-10-CM | POA: Diagnosis not present

## 2023-04-02 MED ORDER — PROGESTERONE 200 MG PO CAPS: ORAL_CAPSULE | ORAL | 2 refills | Status: DC

## 2023-04-02 MED ORDER — ESTRADIOL 0.0375 MG/24HR TD PTWK: 0.0375 mg | MEDICATED_PATCH | TRANSDERMAL | 2 refills | Status: DC

## 2023-05-07 ENCOUNTER — Ambulatory Visit (HOSPITAL_COMMUNITY)
Admission: EM | Admit: 2023-05-07 | Discharge: 2023-05-07 | Disposition: A | Discharge status: Home / Self Care | Attending: Family Medicine | Admitting: Family Medicine

## 2023-05-07 ENCOUNTER — Encounter (HOSPITAL_COMMUNITY): Payer: Self-pay

## 2023-05-07 DIAGNOSIS — N3090 Cystitis, unspecified without hematuria: Secondary | ICD-10-CM | POA: Diagnosis present

## 2023-05-07 LAB — POCT URINALYSIS DIP (MANUAL ENTRY)
Bilirubin, UA: NEGATIVE
Glucose, UA: NEGATIVE mg/dL
Ketones, POC UA: NEGATIVE mg/dL
Nitrite, UA: NEGATIVE
Protein Ur, POC: NEGATIVE mg/dL
Spec Grav, UA: <=1.005 — AB
Urobilinogen, UA: 0.2 U/dL
pH, UA: 6

## 2023-05-07 MED ORDER — CEPHALEXIN 500 MG PO CAPS: 500.0000 mg | ORAL_CAPSULE | Freq: Two times a day (BID) | ORAL | 0 refills | Status: DC

## 2023-05-07 NOTE — ED Triage Notes (Signed)
 Pt c/o burning on urination with urgency and frequency since 1pm today. Denies taking any meds.

## 2023-05-07 NOTE — ED Provider Notes (Signed)
 [facility]    ASSESSMENT & PLAN:  1. Cystitis    Begin: Meds ordered this encounter  Medications   cephALEXin (KEFLEX) 500 MG capsule    Sig: Take 1 capsule (500 mg total) by mouth 2 (two) times daily.    Dispense:  10 capsule    Refill:  0   No signs of pyelonephritis. Urine culture sent. Will notify patient of any significant results. Ensure proper hydration. Will follow up with her PCP or here if not showing improvement over the next 48 hours, sooner if needed.    Discharge Instructions      You have had labs (urine culture) sent today. We will call you with any significant abnormalities or if there is need to begin or change treatment or pursue further follow up.  You may also review your test results online through MyChart. If you do not have a MyChart account, instructions to sign up should be on your discharge paperwork.      Outlined signs and symptoms indicating need for more acute intervention. Patient verbalized understanding. After Visit Summary given.  SUBJECTIVE:  Ariana Edwards is a 53 y.o. female who complains of urinary frequency, urgency and dysuria; onset today. Without associated flank pain, fever, chills, vaginal discharge or bleeding. Gross hematuria: not present. No specific aggravating or alleviating factors reported. No LE edema. Normal PO intake without n/v/d. Without specific abdominal pain. Ambulatory without difficulty. No tx PTA. LMP: Patient's last menstrual period was 02/23/2021 (exact date).  OBJECTIVE:  Vitals:   05/07/23 1816  BP: 121/79  Pulse: 77  Resp: 18  Temp: 98.9 F (37.2 C)  TempSrc: Oral  SpO2: 98%   General appearance: alert; no distress Psychological: alert and cooperative; normal mood and affect  Labs Reviewed  POCT URINALYSIS DIP (MANUAL ENTRY) - Abnormal; Notable for the following components:      Result Value   Spec Grav, UA <=1.005 (*)    Blood, UA moderate (*)    Leukocytes, UA  Small (1+) (*)    All other components within normal limits  URINE CULTURE    Allergies  Allergen Reactions   Meloxicam Other (See Comments)    abd  pain    Sulfa Antibiotics Rash    Past Medical History:  Diagnosis Date   Arthritis    left hand    Hyperlipidemia    Rosacea    Uterine fibroid    Social History   Socioeconomic History   Marital status: Married    Spouse name: Not on file   Number of children: 0   Years of education: Not on file   Highest education level: Not on file  Occupational History   Not on file  Tobacco Use   Smoking status: Never   Smokeless tobacco: Never  Vaping Use   Vaping status: Never Used  Substance and Sexual Activity   Alcohol use: Yes    Alcohol/week: 2.0 standard drinks of alcohol    Types: 2 Standard drinks or equivalent per week    Comment: socially   Drug use: No   Sexual activity: Yes    Partners: Male    Birth control/protection: Post-menopausal  Other Topics Concern   Not on file  Social History Narrative   Not on file   Social Drivers of Health   Financial Resource Strain: Not on file  Food Insecurity: Not on file  Transportation Needs: Not on file  Physical Activity: Not on file  Stress: Not on file  Social Connections: Not on file  Intimate Partner Violence: Not on file   Family History  Problem Relation Age of Onset   Hypertension Mother    Liver cancer Mother    Stomach cancer Mother    Thyroid disease Sister    Hypertension Maternal Grandmother    Heart attack Maternal Grandmother    Cancer Maternal Grandfather    Prostate cancer Maternal Glynda Jaeger, MD 05/07/23 1901

## 2023-05-07 NOTE — Discharge Instructions (Signed)
 You have had labs (urine culture) sent today. We will call you with any significant abnormalities or if there is need to begin or change treatment or pursue further follow up.  You may also review your test results online through MyChart. If you do not have a MyChart account, instructions to sign up should be on your discharge paperwork.

## 2023-05-09 LAB — URINE CULTURE: Culture: >=100000 — AB

## 2023-06-23 ENCOUNTER — Ambulatory Visit: Admitting: Emergency Medicine

## 2023-07-20 ENCOUNTER — Encounter: Payer: Self-pay | Admitting: Obstetrics and Gynecology

## 2023-07-21 NOTE — Telephone Encounter (Signed)
 Last Vitamin D  36 on 12/31/22  AEX 10/16/22

## 2023-07-22 ENCOUNTER — Telehealth: Payer: Self-pay

## 2023-07-22 NOTE — Telephone Encounter (Signed)
Appt scheduled for 7/7. 

## 2023-07-22 NOTE — Telephone Encounter (Signed)
 Copied from CRM [id number]. Topic: General - Other >> Jul 22, 2023  8:10 AM Dorisann Garre T wrote: Reason for CRM: patient stated she will be [place] the country and wanted to know if she could be seen sooner then August I offered July to her she stated she will need something sooner she would like a call back

## 2023-08-11 ENCOUNTER — Ambulatory Visit (INDEPENDENT_AMBULATORY_CARE_PROVIDER_SITE_OTHER)
Admission: RE | Admit: 2023-08-11 | Discharge: 2023-08-11 | Disposition: A | Discharge status: Home / Self Care | Source: Ambulatory Visit | Attending: Emergency Medicine

## 2023-08-11 ENCOUNTER — Ambulatory Visit (INDEPENDENT_AMBULATORY_CARE_PROVIDER_SITE_OTHER): Admitting: Emergency Medicine

## 2023-08-11 ENCOUNTER — Ambulatory Visit: Payer: Self-pay | Admitting: Emergency Medicine

## 2023-08-11 ENCOUNTER — Encounter: Payer: Self-pay | Admitting: Emergency Medicine

## 2023-08-11 VITALS — BP 118/72 | HR 94 | Temp 98.8°F | Ht 61.0 in | Wt 113.0 lb

## 2023-08-11 DIAGNOSIS — Z130 Encounter for screening for diseases of the blood and blood-forming organs and certain disorders involving the immune mechanism: Secondary | ICD-10-CM | POA: Diagnosis not present

## 2023-08-11 DIAGNOSIS — Z13220 Encounter for screening for lipoid disorders: Secondary | ICD-10-CM

## 2023-08-11 DIAGNOSIS — Z1329 Encounter for screening for other suspected endocrine disorder: Secondary | ICD-10-CM | POA: Diagnosis not present

## 2023-08-11 DIAGNOSIS — R634 Abnormal weight loss: Secondary | ICD-10-CM | POA: Insufficient documentation

## 2023-08-11 DIAGNOSIS — Z0000 Encounter for general adult medical examination without abnormal findings: Secondary | ICD-10-CM | POA: Diagnosis not present

## 2023-08-11 DIAGNOSIS — Z0001 Encounter for general adult medical examination with abnormal findings: Secondary | ICD-10-CM

## 2023-08-11 DIAGNOSIS — Z780 Asymptomatic menopausal state: Secondary | ICD-10-CM

## 2023-08-11 DIAGNOSIS — Z13228 Encounter for screening for other metabolic disorders: Secondary | ICD-10-CM

## 2023-08-11 LAB — CBC WITH DIFFERENTIAL/PLATELET
Basophils Absolute: 0.1 K/uL (ref 0.0–0.1)
Basophils Relative: 0.9 % (ref 0.0–3.0)
Eosinophils Absolute: 0.3 K/uL (ref 0.0–0.7)
Eosinophils Relative: 4.2 % (ref 0.0–5.0)
HCT: 43.6 % (ref 36.0–46.0)
Hemoglobin: 14.4 g/dL (ref 12.0–15.0)
Lymphocytes Relative: 30.6 % (ref 12.0–46.0)
Lymphs Abs: 2.2 K/uL (ref 0.7–4.0)
MCHC: 33 g/dL (ref 30.0–36.0)
MCV: 89.4 fl (ref 78.0–100.0)
Monocytes Absolute: 0.4 K/uL (ref 0.1–1.0)
Monocytes Relative: 5.3 % (ref 3.0–12.0)
Neutro Abs: 4.2 K/uL (ref 1.4–7.7)
Neutrophils Relative %: 59 % (ref 43.0–77.0)
Platelets: 302 K/uL (ref 150.0–400.0)
RBC: 4.88 Mil/uL (ref 3.87–5.11)
RDW: 13.5 % (ref 11.5–15.5)
WBC: 7.1 K/uL (ref 4.0–10.5)

## 2023-08-11 LAB — COMPREHENSIVE METABOLIC PANEL WITH GFR
ALT: 10 U/L (ref 0–35)
AST: 13 U/L (ref 0–37)
Albumin: 4.5 g/dL (ref 3.5–5.2)
Alkaline Phosphatase: 62 U/L (ref 39–117)
BUN: 17 mg/dL (ref 6–23)
CO2: 28 meq/L (ref 19–32)
Calcium: 9.6 mg/dL (ref 8.4–10.5)
Chloride: 102 meq/L (ref 96–112)
Creatinine, Ser: 0.82 mg/dL (ref 0.40–1.20)
GFR: 81.33 mL/min (ref >60.00)
Glucose, Bld: 79 mg/dL (ref 70–99)
Potassium: 4.3 meq/L (ref 3.5–5.1)
Sodium: 137 meq/L (ref 135–145)
Total Bilirubin: 0.5 mg/dL (ref 0.2–1.2)
Total Protein: 7.2 g/dL (ref 6.0–8.3)

## 2023-08-11 LAB — URINALYSIS
Bilirubin Urine: NEGATIVE
Hgb urine dipstick: NEGATIVE
Ketones, ur: NEGATIVE
Leukocytes,Ua: NEGATIVE
Nitrite: NEGATIVE
Specific Gravity, Urine: <=1.005 — AB (ref 1.000–1.030)
Total Protein, Urine: NEGATIVE
Urine Glucose: NEGATIVE
Urobilinogen, UA: 0.2 (ref 0.0–1.0)
pH: 6 (ref 5.0–8.0)

## 2023-08-11 LAB — HEMOGLOBIN A1C: Hgb A1c MFr Bld: 5.9 % (ref 4.6–6.5)

## 2023-08-11 LAB — LIPID PANEL
Cholesterol: 258 mg/dL — ABNORMAL HIGH (ref 0–200)
HDL: 69.5 mg/dL (ref >39.00)
LDL Cholesterol: 148 mg/dL — ABNORMAL HIGH (ref 0–99)
NonHDL: 188.33
Total CHOL/HDL Ratio: 4
Triglycerides: 201 mg/dL — ABNORMAL HIGH (ref 0.0–149.0)
VLDL: 40.2 mg/dL — ABNORMAL HIGH (ref 0.0–40.0)

## 2023-08-11 LAB — VITAMIN B12: Vitamin B-12: 517 pg/mL (ref 211–911)

## 2023-08-11 LAB — VITAMIN D 25 HYDROXY (VIT D DEFICIENCY, FRACTURES): VITD: 52.13 ng/mL (ref 30.00–100.00)

## 2023-08-11 NOTE — Patient Instructions (Signed)
 Mantenimiento de Radiographer, therapeutic en las mujeres Health Maintenance, Female Adoptar un estilo de vida saludable y recibir atencin preventiva son importantes para promover la salud y Counsellor. Consulte al mdico sobre: El esquema adecuado para hacerse pruebas y exmenes peridicos. Cosas que puede hacer por su cuenta para prevenir enfermedades y [place] sano. Qu debo saber sobre la dieta, el peso y el ejercicio? Consuma una dieta saludable  Consuma una dieta que incluya muchas verduras, frutas, productos lcteos con bajo contenido de [place] y Associate Professor. No consuma muchos alimentos ricos en grasas slidas, azcares agregados o sodio. Mantenga un peso saludable El ndice de masa muscular [facility]) se [place] para identificar problemas de [place]. Proporciona una estimacin de la grasa corporal basndose en el peso y la altura. Su mdico puede ayudarle a Engineer, site [facility]MC y a Personnel officer o Pharmacologist un peso saludable. Haga ejercicio con regularidad Haga ejercicio con regularidad. Esta es una de las prcticas ms importantes que puede hacer por su salud. La [employer] de los adultos deben seguir estas pautas: Education officer, environmental, al menos, 150 minutos de actividad fsica por semana. El ejercicio debe aumentar la frecuencia cardaca y Media planner transpirar (ejercicio de intensidad moderada). Hacer ejercicios de fortalecimiento por lo [employer] por semana. Agregue esto a su plan de ejercicio de intensidad moderada. Pase menos tiempo sentada. Incluso la actividad fsica ligera puede ser beneficiosa. Controle sus niveles de colesterol y lpidos en la sangre Comience a realizarse anlisis de lpidos y Oncologist en la sangre a los 20 aos y luego reptalos cada 5 aos. Hgase controlar los niveles de colesterol con mayor frecuencia si: Sus niveles de lpidos y colesterol son altos. Es mayor de 40 aos. Presenta un alto riesgo de padecer enfermedades cardacas. Qu debo saber sobre las pruebas de deteccin del  cncer? Segn su historia clnica y sus antecedentes familiares, es posible que deba realizarse pruebas de deteccin del cncer en diferentes edades. Esto puede incluir pruebas de deteccin de lo siguiente: Cncer de mama. Cncer de cuello uterino. Cncer colorrectal. Cncer de piel. Cncer de pulmn. Qu debo saber sobre la enfermedad cardaca, la diabetes y la hipertensin arterial? Presin arterial y enfermedad cardaca La hipertensin arterial causa enfermedades cardacas y [place] el riesgo de accidente cerebrovascular. Es ms probable que esto se manifieste en las personas que tienen lecturas de presin arterial alta o tienen sobrepeso. Hgase controlar la presin arterial: Cada 3 a 5 aos si tiene entre 18 y 50 aos. Todos los aos si es mayor de 40 aos. Diabetes Realcese exmenes de deteccin de la diabetes con regularidad. Este anlisis revisa el nivel de azcar en la sangre en [place]. Hgase las pruebas de deteccin: Cada tres aos despus de los 40 aos de edad si tiene un peso normal y un bajo riesgo de padecer diabetes. Con ms frecuencia y a partir de [place] edad inferior si tiene sobrepeso o un alto riesgo de padecer diabetes. Qu debo saber sobre la prevencin de infecciones? Hepatitis B Si tiene un riesgo ms alto de contraer hepatitis B, debe someterse a un examen de deteccin de este virus. Hable con el mdico para averiguar si tiene riesgo de contraer la infeccin por hepatitis B. Hepatitis C Se recomienda el anlisis a: [employer] 1946 y 1965. Todas las personas que tengan un riesgo de haber contrado hepatitis C. Enfermedades de transmisin sexual (ETS) Hgase las pruebas de Airline pilot de ITS, incluidas la gonorrea y la clamidia, si: Es sexualmente activa y es Adult nurse de 24  aos. Es mayor de 24 aos, y el mdico le informa que corre riesgo de tener este tipo de infecciones. La actividad sexual ha cambiado desde que le hicieron la ltima prueba de  deteccin y tiene un riesgo mayor de Warehouse manager clamidia o Copy. Pregntele al mdico si usted tiene riesgo. Pregntele al mdico si usted tiene un alto riesgo de Primary school teacher VIH. El mdico tambin puede recomendarle un medicamento recetado para ayudar a evitar la infeccin por el VIH. Si elige tomar medicamentos para prevenir el VIH, primero debe [employer] de deteccin del VIH. Luego debe hacerse anlisis cada 3 meses mientras est tomando los medicamentos. Embarazo Si est por dejar de Armed forces training and education officer (fase premenopusica) y usted puede quedar [place], busque asesoramiento antes de [place]. T[place] de 400 a 800 microgramos (mcg) de cido [employer] si [place]. Pida mtodos de control de la natalidad (anticonceptivos) si desea evitar un embarazo no deseado. Osteoporosis y [place] La osteoporosis es una enfermedad en la que los huesos pierden los minerales y la fuerza por el avance de la edad. El resultado pueden ser fracturas en los [place]. Si tiene 65 aos o ms, o si est en riesgo de sufrir osteoporosis y fracturas, pregunte a su mdico si debe: Hacerse pruebas de deteccin de prdida sea. Tomar un suplemento de calcio o de vitamina D para reducir el riesgo de fracturas. Recibir terapia de reemplazo hormonal (TRH) para tratar los sntomas de la menopausia. Siga estas indicaciones en su casa: Consumo de alcohol No beba alcohol si: Su mdico le indica no hacerlo. Est embarazada, puede estar embarazada o est tratando de [place]. Si bebe alcohol: Limite la cantidad que bebe a lo siguiente: De 0 a 1 bebida por da. Sepa cunta cantidad de alcohol hay en las bebidas que toma. En los [address], una medida equivale a una botella de cerveza de 12 oz (355 ml), un vaso de vino de 5 oz (148 ml) o un vaso de una bebida alcohlica de alta graduacin de 1 oz (44 ml). Estilo de vida No consuma ningn producto que contenga nicotina o tabaco. Estos  productos incluyen cigarrillos, tabaco para Theatre manager y aparatos de vapeo, como los Administrator, Civil Service. Si necesita ayuda para dejar de consumir estos productos, consulte al mdico. No consuma drogas. No comparta agujas. Solicite ayuda a su mdico si necesita apoyo o informacin para abandonar las drogas. Indicaciones generales Realcese los estudios de rutina de [address], dentales y de Wellsite geologist. Mantngase al da con las vacunas. Infrmele a su mdico si: Se siente deprimida con frecuencia. Alguna vez ha sido vctima de [place] o no se siente seguro en su casa. Resumen Adoptar un estilo de vida saludable y recibir atencin preventiva son importantes para promover la salud y Counsellor. Siga las instrucciones del mdico acerca de una dieta saludable, el ejercicio y la realizacin de pruebas o exmenes para Hotel manager. Siga las instrucciones del mdico con respecto al control del colesterol y la presin arterial. Esta informacin no tiene Theme park manager el consejo del mdico. Asegrese de hacerle al mdico cualquier pregunta que tenga. Document Revised: 06/29/2020 Document Reviewed: 06/29/2020 Elsevier Patient Education  2024 [employer].

## 2023-08-11 NOTE — Assessment & Plan Note (Addendum)
 Unintentional.  Started when she started hormonal treatment for menopausal symptoms.  No associated symptoms Differential diagnosis discussed Diet and nutrition discussed Wt Readings from Last 3 Encounters:  08/11/23 113 lb (51.3 kg)  04/02/23 117 lb (53.1 kg)  12/31/22 128 lb (58.1 kg)  Needs workup.  Recommend blood work today along with urinalysis, chest x-ray, and CT scan of abdomen and pelvis. Non-smoker. Recent mammogram was normal. Colonoscopy from 2022.  Report reviewed.  Couple of small polyps.  Pathology report reviewed.  Tubular adenomas.  No high-grade dysplasia.

## 2023-08-11 NOTE — Assessment & Plan Note (Signed)
 Was able to follow-up with gynecologist Was started on hormonal therapy Takes progesterone  200 mg at bedtime daily and weekly estrogen patches

## 2023-08-11 NOTE — Progress Notes (Signed)
 [place] 53 y.o.   Chief Complaint  Patient presents with   Annual Exam    Patient here for physical. She has noticed a big weight loss within the year and doesn't know where it come from, calcium what kind to take? , also mentions wanting to talk about colonoscopy     HISTORY OF PRESENT ILLNESS: This is a 53 y.o. female here for annual exam Concerned about weight loss over the past 6 months Last colonoscopy in 2022 Up-to-date with mammogram Non-smoker Wt Readings from Last 3 Encounters:  08/11/23 113 lb (51.3 kg)  04/02/23 117 lb (53.1 kg)  12/31/22 128 lb (58.1 kg)     HPI   Prior to Admission medications   Medication Sig Start Date End Date Taking? Authorizing Provider  estradiol  (CLIMARA ) 0.0375 mg/24hr patch Place 1 patch (0.0375 mg total) onto the skin once a week. 04/02/23  Yes Cathlyn JAYSON Nikki Bobie FORBES, MD  progesterone  (PROMETRIUM ) 200 MG capsule Take one capsule (200 mg) by mouth at bedtime. 04/02/23  Yes Cathlyn JAYSON Nikki Bobie FORBES, MD  VITAMIN D  PO Take by mouth daily.   Yes [provider]  cephALEXin  (KEFLEX ) 500 MG capsule Take 1 capsule (500 mg total) by mouth 2 (two) times daily. 05/07/23   Rolinda Rogue, MD  estradiol  (ESTRACE  VAGINAL) 0.1 MG/GM vaginal cream Place 1/2 gram per vagina at bedtime 2 - 3 nights per week. Patient not taking: Reported on 08/11/2023 12/24/22   Cathlyn JAYSON Nikki Bobie FORBES, MD    Allergies  Allergen Reactions   Meloxicam  Other (See Comments)    abd  pain    Sulfa  Antibiotics Rash    Patient Active Problem List   Diagnosis Date Noted   Menopause 08/15/2022   Menopausal symptoms 08/15/2022   Fibroids 07/20/2014   Fibroid, uterine    DYSPEPSIA&OTHER SPEC DISORDERS FUNCTION STOMACH 06/23/2008    Past Medical History:  Diagnosis Date   Arthritis    left hand    Hyperlipidemia    Rosacea    Uterine fibroid     Past Surgical History:  Procedure Laterality Date   BREAST BIOPSY     BREAST SURGERY      x 2 for Fibroids    FRACTURE SURGERY     HAND SURGERY     IR GENERIC HISTORICAL  05/04/2014   IR RADIOLOGIST EVAL & MGMT 05/04/2014 Rome Hall, MD [facility] INTER[facility] RAD   IR GENERIC HISTORICAL  03/07/2015   IR RADIOLOGIST EVAL & MGMT 03/07/2015 [facility] INTERV RAD- for uterine fibroids    UTERINE FIBROID EMBOLIZATION      Social History   Socioeconomic History   Marital status: Married    Spouse name: Not on file   Number of children: 0   Years of education: Not on file   Highest education level: Bachelor's degree (e.g., BA, AB, BS)  Occupational History   Not on file  Tobacco Use   Smoking status: Never   Smokeless tobacco: Never  Vaping Use   Vaping status: Never Used  Substance and Sexual Activity   Alcohol use: Yes    Alcohol/week: 2.0 standard drinks of alcohol    Types: 2 Standard drinks or equivalent per week    Comment: socially   Drug use: No   Sexual activity: Yes    Partners: Male    Birth control/protection: Post-menopausal  Other Topics Concern   Not on file  Social History Narrative   Not on file   Social  Drivers of Corporate investment banker Strain: Low Risk  (08/07/2023)   Overall Financial Resource Strain (CARDIA)    Difficulty of Paying Living Expenses: Not very hard  Food Insecurity: No Food Insecurity (08/07/2023)   Hunger Vital Sign    Worried About Running Out of Food in the Last Year: Never true    Ran Out of Food in the Last Year: Never true  Transportation Needs: No Transportation Needs (08/07/2023)   PRAPARE - Administrator, Civil Service (Medical): No    Lack of Transportation (Non-Medical): No  Physical Activity: Sufficiently Active (08/07/2023)   Exercise Vital Sign    Days of Exercise per Week: 3 days    Minutes of Exercise per Session: 100 min  Stress: No Stress Concern Present (08/07/2023)   [employer] of Occupational Health - Occupational Stress Questionnaire    Feeling of Stress: Not at all  Social Connections: Moderately  Isolated (08/07/2023)   Social Connection and Isolation Panel    Frequency of Communication with Friends and Family: More than three times a week    Frequency of Social Gatherings with Friends and Family: Once a week    Attends Religious Services: Never    Database administrator or Organizations: No    Attends Engineer, structural: Not on file    Marital Status: Married  Catering manager Violence: Not on file    Family History  Problem Relation Age of Onset   Hypertension Mother    Liver cancer Mother    Stomach cancer Mother    Thyroid  disease Sister    Hypertension Maternal Grandmother    Heart attack Maternal Grandmother    Cancer Maternal Grandfather    Prostate cancer Maternal Grandfather      Review of Systems  Constitutional:  Positive for weight loss.  HENT: Negative.  Negative for congestion and sore throat.   Respiratory: Negative.  Negative for cough and shortness of breath.   Cardiovascular: Negative.  Negative for chest pain and palpitations.  Gastrointestinal:  Negative for abdominal pain, diarrhea, nausea and vomiting.  Genitourinary: Negative.  Negative for dysuria and hematuria.  Skin: Negative.  Negative for rash.  Neurological: Negative.  Negative for dizziness and headaches.    Vitals:   08/11/23 1100  BP: 118/72  Pulse: 94  Temp: 98.8 F (37.1 C)  SpO2: 98%    Physical Exam Vitals reviewed.  Constitutional:      Appearance: Normal appearance.  HENT:     Head: Normocephalic.     Right Ear: Tympanic membrane, ear canal and external ear normal.     Left Ear: Tympanic membrane, ear canal and external ear normal.     Mouth/Throat:     Mouth: Mucous membranes are moist.     Pharynx: Oropharynx is clear.  Eyes:     Extraocular Movements: Extraocular movements intact.     Conjunctiva/sclera: Conjunctivae normal.     Pupils: Pupils are equal, round, and reactive to light.  Cardiovascular:     Rate and Rhythm: Normal rate and regular  rhythm.     Pulses: Normal pulses.     Heart sounds: Normal heart sounds.  Pulmonary:     Effort: Pulmonary effort is normal.     Breath sounds: Normal breath sounds.  Abdominal:     Palpations: Abdomen is soft.     Tenderness: There is no abdominal tenderness.  Musculoskeletal:     Cervical back: No tenderness.  Lymphadenopathy:  Cervical: No cervical adenopathy.  Skin:    General: Skin is warm and dry.     Capillary Refill: Capillary refill takes less than 2 seconds.  Neurological:     General: No focal deficit present.     Mental Status: She is alert and oriented to person, place, and time.  Psychiatric:        Mood and Affect: Mood normal.        Behavior: Behavior normal.      ASSESSMENT & PLAN: Problem List Items Addressed This Visit       Other   Menopause   Was able to follow-up with gynecologist Was started on hormonal therapy Takes progesterone  200 mg at bedtime daily and weekly estrogen patches      Weight loss   Unintentional.  Started when she started hormonal treatment for menopausal symptoms.  No associated symptoms Differential diagnosis discussed Diet and nutrition discussed Wt Readings from Last 3 Encounters:  08/11/23 113 lb (51.3 kg)  04/02/23 117 lb (53.1 kg)  12/31/22 128 lb (58.1 kg)  Needs workup.  Recommend blood work today along with urinalysis, chest x-ray, and CT scan of abdomen and pelvis. Non-smoker. Recent mammogram was normal. Colonoscopy from 2022.  Report reviewed.  Couple of small polyps.  Pathology report reviewed.  Tubular adenomas.  No high-grade dysplasia.       Relevant Orders   Urinalysis   DG Chest 2 View   CBC with Differential/Platelet   Comprehensive metabolic panel with GFR   Hemoglobin A1c   Lipid panel   Vitamin B12   VITAMIN D  25 Hydroxy (Vit-D Deficiency, Fractures)   Thyroid  Panel With TSH   CT ABDOMEN PELVIS W WO CONTRAST   Other Visit Diagnoses       Encounter for general adult medical  examination with abnormal findings    -  Primary   Relevant Orders   CBC with Differential/Platelet   Comprehensive metabolic panel with GFR     Screening for deficiency anemia       Relevant Orders   CBC with Differential/Platelet     Screening for lipoid disorders       Relevant Orders   Lipid panel     Screening for endocrine, metabolic and immunity disorder       Relevant Orders   Comprehensive metabolic panel with GFR   Hemoglobin A1c   Vitamin B12   VITAMIN D  25 Hydroxy (Vit-D Deficiency, Fractures)   Thyroid  Panel With TSH      Modifiable risk factors discussed with patient. Anticipatory guidance according to age provided. The following topics were also discussed: Social Determinants of Health Smoking.  Non-smoker Diet and nutrition Benefits of exercise Cancer screening and review of most recent colonoscopy and mammogram reports Vaccinations review and recommendations Cardiovascular risk assessment and need for blood work Mental health including depression and anxiety Fall and accident prevention  Patient Instructions  Mantenimiento de Radiographer, therapeutic en las mujeres Health Maintenance, Female Adoptar un estilo de vida saludable y recibir atencin preventiva son importantes para promover la salud y Counsellor. Consulte al mdico sobre: El esquema adecuado para hacerse pruebas y exmenes peridicos. Cosas que puede hacer por su cuenta para prevenir enfermedades y [place] sano. Qu debo saber sobre la dieta, el peso y el ejercicio? Consuma una dieta saludable  Consuma una dieta que incluya muchas verduras, frutas, productos lcteos con bajo contenido de grasa y protenas magras. No consuma muchos alimentos ricos en grasas slidas, azcares agregados o sodio.  Mantenga un peso saludable El ndice de masa muscular [facility]) se cocos (keeling) islands para identificar problemas de [place]. Proporciona una estimacin de la grasa corporal basndose en el peso y la altura. Su mdico puede ayudarle  a determinar su [facility] y a Personnel officer o Pharmacologist un peso saludable. Haga ejercicio con regularidad Haga ejercicio con regularidad. Esta es una de las prcticas ms importantes que puede hacer por su salud. La [employer] de los adultos deben seguir estas pautas: Education officer, environmental, al menos, 150 minutos de actividad fsica por semana. El ejercicio debe aumentar la frecuencia cardaca y Media planner transpirar (ejercicio de intensidad moderada). Hacer ejercicios de fortalecimiento por lo [employer] por semana. Agregue esto a su plan de ejercicio de intensidad moderada. Pase menos tiempo sentada. Incluso la actividad fsica ligera puede ser beneficiosa. Controle sus niveles de colesterol y lpidos en la sangre Comience a realizarse anlisis de lpidos y Oncologist en la sangre a los 20 aos y luego reptalos cada 5 aos. Hgase controlar los niveles de colesterol con mayor frecuencia si: Sus niveles de lpidos y colesterol son altos. Es mayor de 40 aos. Presenta un alto riesgo de padecer enfermedades cardacas. Qu debo saber sobre las pruebas de deteccin del cncer? Segn su historia clnica y sus antecedentes familiares, es posible que deba realizarse pruebas de deteccin del cncer en diferentes edades. Esto puede incluir pruebas de deteccin de lo siguiente: Cncer de mama. Cncer de cuello uterino. Cncer colorrectal. Cncer de piel. Cncer de pulmn. Qu debo saber sobre la enfermedad cardaca, la diabetes y la hipertensin arterial? Presin arterial y enfermedad cardaca La hipertensin arterial causa enfermedades cardacas y lesotho el riesgo de accidente cerebrovascular. Es ms probable que esto se manifieste en las personas que tienen lecturas de presin arterial alta o tienen sobrepeso. Hgase controlar la presin arterial: Cada 3 a 5 aos si tiene entre 18 y 104 aos. Todos los aos si es mayor de 40 aos. Diabetes Realcese exmenes de deteccin de la diabetes con regularidad. Este anlisis revisa  el nivel de azcar en la sangre en [place]. Hgase las pruebas de deteccin: Cada tres aos despus de los 40 aos de edad si tiene un peso normal y un bajo riesgo de padecer diabetes. Con ms frecuencia y a partir de [place] edad inferior si tiene sobrepeso o un alto riesgo de padecer diabetes. Qu debo saber sobre la prevencin de infecciones? Hepatitis B Si tiene un riesgo ms alto de contraer hepatitis B, debe someterse a un examen de deteccin de este virus. Hable con el mdico para averiguar si tiene riesgo de contraer la infeccin por hepatitis B. Hepatitis C Se recomienda el anlisis a: [employer] 1945 y 1965. Todas las personas que tengan un riesgo de haber contrado hepatitis C. Enfermedades de transmisin sexual (ETS) Hgase las pruebas de Airline pilot de ITS, incluidas la gonorrea y la clamidia, si: Es sexualmente activa y es menor de [address]. Es mayor de [address], y Public affairs consultant informa que corre riesgo de tener este tipo de infecciones. La actividad sexual ha cambiado desde que le hicieron la ltima prueba de deteccin y tiene un riesgo mayor de tener clamidia o Copy. Pregntele al mdico si usted tiene riesgo. Pregntele al mdico si usted tiene un alto riesgo de Primary school teacher VIH. El mdico tambin puede recomendarle un medicamento recetado para ayudar a evitar la infeccin por el VIH. Si elige tomar medicamentos para prevenir el VIH, primero debe [employer] de deteccin del VIH. Luego debe hacerse  anlisis cada 3 meses mientras est tomando los medicamentos. Embarazo Si est por dejar de menstruar (fase premenopusica) y usted puede quedar embarazada, busque asesoramiento antes de quedar embarazada. Tome de 400 a 800 microgramos (mcg) de cido flico todos los das si queda embarazada. Pida mtodos de control de la natalidad (anticonceptivos) si desea evitar un embarazo no deseado. Osteoporosis y rwanda La osteoporosis es una enfermedad en la que los  huesos pierden los minerales y la fuerza por el avance de la edad. El resultado pueden ser fracturas en los [place]. Si tiene 65 aos o ms, o si est en riesgo de sufrir osteoporosis y fracturas, pregunte a su mdico si debe: Hacerse pruebas de deteccin de prdida sea. Tomar un suplemento de calcio o de vitamina D para reducir el riesgo de fracturas. Recibir terapia de reemplazo hormonal (TRH) para tratar los sntomas de la menopausia. Siga estas indicaciones en su casa: Consumo de alcohol No beba alcohol si: Su mdico le indica no hacerlo. Est embarazada, puede estar embarazada o est tratando de quedar embarazada. Si bebe alcohol: Limite la cantidad que bebe a lo siguiente: De 0 a 1 bebida por da. Sepa cunta cantidad de alcohol hay en las bebidas que toma. En los [address], una medida equivale a una botella de cerveza de 12 oz (355 ml), un vaso de vino de 5 oz (148 ml) o un vaso de una bebida alcohlica de alta graduacin de 1 oz (44 ml). Estilo de vida No consuma ningn producto que contenga nicotina o tabaco. Estos productos incluyen cigarrillos, tabaco para Theatre manager y aparatos de vapeo, como los cigarrillos electrnicos. Si necesita ayuda para dejar de consumir estos productos, consulte al mdico. No consuma drogas. No comparta agujas. Solicite ayuda a su mdico si necesita apoyo o informacin para abandonar las drogas. Indicaciones generales Realcese los estudios de rutina de [address], dentales y de Wellsite geologist. Mantngase al da con las vacunas. Infrmele a su mdico si: Se siente deprimida con frecuencia. Alguna vez ha sido vctima de maltrato o no se siente seguro en su casa. Resumen Adoptar un estilo de vida saludable y recibir atencin preventiva son importantes para promover la salud y Counsellor. Siga las instrucciones del mdico acerca de una dieta saludable, el ejercicio y la realizacin de pruebas o exmenes para Hotel manager. Siga las instrucciones del  mdico con respecto al control del colesterol y la presin arterial. Esta informacin no tiene Theme park manager el consejo del mdico. Asegrese de hacerle al mdico cualquier pregunta que tenga. Document Revised: 06/29/2020 Document Reviewed: 06/29/2020 Elsevier Patient Education  2024 Elsevier Inc.      Emil Schaumann, MD [facility]

## 2023-08-12 LAB — THYROID PANEL WITH TSH
Free Thyroxine Index: 2.8 (ref 1.4–3.8)
T3 Uptake: 31 % (ref 22–35)
T4, Total: 9 ug/dL (ref 5.1–11.9)
TSH: 1.96 m[IU]/L

## 2023-08-13 ENCOUNTER — Other Ambulatory Visit: Payer: Self-pay | Admitting: Emergency Medicine

## 2023-08-13 DIAGNOSIS — Z1231 Encounter for screening mammogram for malignant neoplasm of breast: Secondary | ICD-10-CM

## 2023-09-15 ENCOUNTER — Ambulatory Visit
Admission: RE | Admit: 2023-09-15 | Discharge: 2023-09-15 | Disposition: A | Discharge status: Home / Self Care | Source: Ambulatory Visit | Attending: Emergency Medicine

## 2023-09-15 DIAGNOSIS — Z1231 Encounter for screening mammogram for malignant neoplasm of breast: Secondary | ICD-10-CM

## 2023-09-17 ENCOUNTER — Ambulatory Visit: Payer: Self-pay | Admitting: Emergency Medicine

## 2023-09-18 ENCOUNTER — Other Ambulatory Visit: Payer: Self-pay | Admitting: Emergency Medicine

## 2023-09-18 DIAGNOSIS — R928 Other abnormal and inconclusive findings on diagnostic imaging of breast: Secondary | ICD-10-CM

## 2023-09-22 ENCOUNTER — Ambulatory Visit
Admission: RE | Admit: 2023-09-22 | Discharge: 2023-09-22 | Disposition: A | Discharge status: Home / Self Care | Source: Ambulatory Visit | Attending: Emergency Medicine | Admitting: Emergency Medicine

## 2023-09-22 ENCOUNTER — Other Ambulatory Visit: Payer: Self-pay | Admitting: Emergency Medicine

## 2023-09-22 DIAGNOSIS — R928 Other abnormal and inconclusive findings on diagnostic imaging of breast: Secondary | ICD-10-CM

## 2023-09-22 DIAGNOSIS — R921 Mammographic calcification found on diagnostic imaging of breast: Secondary | ICD-10-CM

## 2023-10-20 ENCOUNTER — Encounter: Admitting: Emergency Medicine

## 2023-12-11 ENCOUNTER — Other Ambulatory Visit: Payer: Self-pay | Admitting: Obstetrics and Gynecology

## 2023-12-11 NOTE — Telephone Encounter (Signed)
 Med refill request: progesterone   Last AEX: 10/16/22 Next AEX: 12/15/23 Last MMG (if hormonal med) 09/15/23 incomplete, next scheduled for 03/25/24 Refill authorized: last rx 04/02/23 #90 with 2 refills. Please approve or deny

## 2023-12-15 ENCOUNTER — Ambulatory Visit: Admitting: Obstetrics and Gynecology

## 2023-12-15 ENCOUNTER — Encounter: Payer: Self-pay | Admitting: Obstetrics and Gynecology

## 2023-12-15 ENCOUNTER — Other Ambulatory Visit (HOSPITAL_COMMUNITY)
Admission: RE | Admit: 2023-12-15 | Discharge: 2023-12-15 | Disposition: A | Discharge status: Home / Self Care | Source: Ambulatory Visit | Attending: Obstetrics and Gynecology | Admitting: Obstetrics and Gynecology

## 2023-12-15 VITALS — BP 114/76 | HR 101 | Ht 61.75 in | Wt 116.0 lb

## 2023-12-15 DIAGNOSIS — Z124 Encounter for screening for malignant neoplasm of cervix: Secondary | ICD-10-CM | POA: Diagnosis present

## 2023-12-15 DIAGNOSIS — Z79890 Hormone replacement therapy (postmenopausal): Secondary | ICD-10-CM

## 2023-12-15 DIAGNOSIS — Z1331 Encounter for screening for depression: Secondary | ICD-10-CM

## 2023-12-15 DIAGNOSIS — Z01419 Encounter for gynecological examination (general) (routine) without abnormal findings: Secondary | ICD-10-CM

## 2023-12-15 DIAGNOSIS — Z1211 Encounter for screening for malignant neoplasm of colon: Secondary | ICD-10-CM

## 2023-12-15 MED ORDER — ESTRADIOL 0.0375 MG/24HR TD PTWK: 0.0375 mg | MEDICATED_PATCH | TRANSDERMAL | 3 refills | Status: AC

## 2023-12-15 MED ORDER — ESTRADIOL 0.01 % VA CREA
TOPICAL_CREAM | VAGINAL | 3 refills | Status: AC
Start: 2023-12-15 — End: ?

## 2023-12-15 MED ORDER — PROGESTERONE 200 MG PO CAPS: ORAL_CAPSULE | ORAL | 3 refills | Status: AC

## 2023-12-15 NOTE — Patient Instructions (Addendum)
 Calcium rich foods Calcium is a mineral in the body. Of all the minerals in your body, you have the most calcium. Most of the body's calcium supply is stored in bones and teeth. Calcium helps many parts of the body work, including: Blood and blood vessels. Nerves. Hormones. Muscles. Bones and teeth. When your calcium stores are low, you may be at risk for low bone mass, bone loss, and broken bones. When you get enough calcium, it helps to support strong bones and teeth throughout your life. Calcium is especially important for: Children during growth spurts. Females during adolescence. Females who are pregnant or breastfeeding. Females after their menstrual cycle stops (postmenopausal). Females whose menstrual cycle has stopped because of an eating disorder or regular intense exercise. People who can't eat or digest dairy products. People who eat a vegan diet. Recommended daily amounts of calcium: Females (ages 36 to 58): 1,000 mg per day. Females (ages 89 and older): 1,200 mg per day. Males (ages 24 to 21): 1,000 mg per day. Males (ages 80 and older): 1,200 mg per day. Females (ages 52 to 79): 1,300 mg per day. Males (ages 47 to 85): 1,300 mg per day. General information Eat foods that are high in calcium. Try to get most of your calcium from food. Some people may benefit from taking calcium supplements. Check with your health care provider or an expert in healthy eating called a dietitian before starting any calcium supplements. Calcium supplements may interact with certain medicines. Too much calcium may cause other health problems, such as trouble pooping and kidney stones. For the body to absorb calcium, it needs vitamin D . Sources of vitamin D  include: Skin exposure to direct sunlight. Foods, such as egg yolks, liver, mushrooms, saltwater fish, and fortified milk. Vitamin D  supplements. Check with your provider or dietitian before starting any vitamin D  supplements. The amount of  calcium that is absorbed in the body varies with type of food. Talk to a dietitian about what foods are best for you, especially if you are eat a vegan diet or don't eat dairy. What foods are high in calcium?  Foods that are high in calcium contain more than 100 milligrams per serving. Fruits Fortified orange juice or other fruit juice, 300 mg per 8 oz (237 mL) serving. Vegetables Collard greens, 260 mg per 1 cup (130 g) serving, cooked. Kale, 180 mg per 1 cup (118 g) serving, cooked. Bok choy, 180 mg per 1 cup (170 g) serving, cooked Grains Fortified frozen waffles, 200 mg in 2 waffles. Oatmeal, 180 mg in 1 cup (234 g) serving, cooked. Fortified white bread, 175 mg per slice. Meats and other proteins Sardines, canned with bones, 350 mg per 3.75 oz (92 g) serving. Salmon, canned with bones, 168 mg per 3 oz (85 g) serving. Canned shrimp, 125 mg per 3 oz (85 g) serving. Baked beans, 120 mg per 1 cup (266 g) serving. Tofu, firm, made with calcium sulfate, 861 mg per  cup (126 g) serving. Dairy Yogurt, plain, low-fat, 448 mg per 1 cup (245 g) serving Nonfat milk, 300 mg per 1 cup (245 g) serving. American cheese, 145 mg per 1 oz (21 g) serving or 1 slice. Cheddar cheese, 200 mg per 1 oz (28 g) serving or 1 slice. Cottage cheese 2%, 125 mg per  cup (113 g) serving. Fortified soy, rice, or almond milk, 300 mg per 1 cup (237 mL) serving. Mozzarella, part skim, 210 mg per 1 oz (21 g) serving. The items listed  above may not be a complete list of foods high in calcium. Actual amounts of calcium may be different depending on processing. Contact a dietitian for more information. What foods are lower in calcium? Foods that are lower in calcium contain 50 mg or less per serving. Fruits Apple, 1 medium, about 6 mg. Banana, 1 medium, about 12 mg. Vegetables Lettuce, 19 mg per 1 cup (35 g) serving. Tomato, 1 small, about 11 mg. Grains Rice, white, 8 mg per  cup (79 g) serving. Boiled  potatoes, 14 mg per 1 cup (160 g) serving. White bread, 6 mg per slice. Meats and other proteins Egg, 24 mg per 1 egg (50 g). Red meat, 7 mg per 4 oz (80 g) serving. Chicken, 17 mg per 4 oz (113 g) serving. Fish, cod, or trout, 20 mg per 4 oz (140 g) serving. Dairy Cream cheese, regular, 14 mg per 1 Tbsp (15 g) serving. Brie cheese, 50 mg per 1 oz (32 g) serving. The items listed above may not be a complete list of foods lower in calcium. Actual amounts of calcium may be different depending on processing. Contact a dietitian for more information. This information is not intended to replace advice given to you by your health care provider. Make sure you discuss any questions you have with your health care provider. Document Revised: 10/19/2022 Document Reviewed: 10/19/2022 Elsevier Patient Education  2024 [employer]  EXERCISE AND DIET:  We recommended that you start or continue a regular exercise program for good health. Regular exercise means any activity that makes your heart beat faster and makes you sweat.  We recommend exercising at least 30 minutes per day at least 3 days a week, preferably 4 or 5.  We also recommend a diet low in fat and sugar.  Inactivity, poor dietary choices and obesity can cause diabetes, heart attack, stroke, and kidney damage, among others.    ALCOHOL AND SMOKING:  Women should limit their alcohol intake to no more than 7 drinks/beers/glasses of wine (combined, not each!) per week. Moderation of alcohol intake to this level decreases your risk of breast cancer and liver damage. And of course, no recreational drugs are part of a healthy lifestyle.  And absolutely no smoking or even second hand smoke. Most people know smoking can cause heart and lung diseases, but did you know it also contributes to weakening of your bones? Aging of your skin?  Yellowing of your teeth and nails?  CALCIUM AND VITAMIN D :  Adequate intake of calcium and Vitamin D  are recommended.  The  recommendations for exact amounts of these supplements seem to change often, but generally speaking 600 mg of calcium (either carbonate or citrate) and 800 units of Vitamin D  per day seems prudent. Certain women may benefit from higher intake of Vitamin D .  If you are among these women, your doctor will have told you during your visit.    PAP SMEARS:  Pap smears, to check for cervical cancer or precancers,  have traditionally been done yearly, although recent scientific advances have shown that most women can have pap smears less often.  However, every woman still should have a physical exam from her gynecologist every year. It will include a breast check, inspection of the vulva and vagina to check for abnormal growths or skin changes, a visual exam of the cervix, and then an exam to evaluate the size and shape of the uterus and ovaries.  And after 54 years of age, a rectal exam  is indicated to check for rectal cancers. We will also provide age appropriate advice regarding health maintenance, like when you should have certain vaccines, screening for sexually transmitted diseases, bone density testing, colonoscopy, mammograms, etc.   MAMMOGRAMS:  All women over 97 years old should have a yearly mammogram. Many facilities now offer a 3D mammogram, which may cost around $50 extra out of pocket. If possible,  we recommend you accept the option to have the 3D mammogram performed.  It both reduces the number of women who will be called back for extra views which then turn out to be normal, and it is better than the routine mammogram at detecting truly abnormal areas.    COLONOSCOPY:  Colonoscopy to screen for colon cancer is recommended for all women at age 71.  We know, you hate the idea of the prep.  We agree, BUT, having colon cancer and not knowing it is worse!!  Colon cancer so often starts as a polyp that can be seen and removed at colonscopy, which can quite literally save your life!  And if your first  colonoscopy is normal and you have no family history of colon cancer, most women don't have to have it again for 10 years.  Once every ten years, you can do something that may end up saving your life, right?  We will be happy to help you get it scheduled when you are ready.  Be sure to check your insurance coverage so you understand how much it will cost.  It may be covered as a preventative service at no cost, but you should check your particular policy.

## 2023-12-15 NOTE — Progress Notes (Unsigned)
 53 y.o. G40P0000 Married Hispanic female here for annual exam.    Patient is on HRT.  No more spotting.  No more breast pain.  No hot flashes or night sweats unless she drinks coffee.  Energy is much better.   Lost 12 pounds and is at a stable weight.  In [place], she saw her provider who recommended muscle strengthening exercise.   Her cholesterol is elevated.   Doing diet and exercise.    PCP: Purcell Emil Schanz, MD   Patient's last menstrual period was 02/23/2021 (exact date).           Sexually active: Yes.    The current method of family planning is post menopausal status.    Menopausal hormone therapy:  Climara , estrace  & progesterone   Exercising: No.   Smoker:  no  OB History  Gravida Para Term Preterm AB Living  0 0 0 0 0 0  SAB IAB Ectopic Multiple Live Births  0 0 0 0 0     HEALTH MAINTENANCE: Last 2 paps:  08/30/20 neg History of abnormal Pap or positive HPV:  no Mammogram:   09/22/23 L Breast - Breast Density Cat C, BIRADS Cat 3 prob bengn. Has to go back in 6 months   Colonoscopy:  07/26/20 - due this year. Bone Density:  n/a  Result  n/a   Immunization History  Administered Date(s) Administered   Influenza Inj Mdck Quad Pf 02/10/2018   Influenza,inj,Quad PF,6+ Mos 01/11/2016, 12/31/2016   Influenza-Unspecified 12/16/2019   PFIZER Comirnaty(Gray Top)Covid-19 Tri-Sucrose Vaccine 12/21/2019   PFIZER(Purple Top)SARS-COV-2 Vaccination 04/10/2019, 05/01/2019   Zoster Recombinant(Shingrix) 08/03/2020, 12/12/2020      reports that she has never smoked. She has never used smokeless tobacco. She reports current alcohol use of about 2.0 standard drinks of alcohol per week. She reports that she does not use drugs.  Past Medical History:  Diagnosis Date   Arthritis    left hand    Hyperlipidemia    Rosacea    Uterine fibroid     Past Surgical History:  Procedure Laterality Date   BREAST BIOPSY     BREAST SURGERY     x 2 for Fibroids    FRACTURE  SURGERY     HAND SURGERY     IR GENERIC HISTORICAL  05/04/2014   IR RADIOLOGIST EVAL & MGMT 05/04/2014 Rome Hall, MD [facility] INTERV RAD   IR GENERIC HISTORICAL  03/07/2015   IR RADIOLOGIST EVAL & MGMT 03/07/2015 [facility] INTERV RAD- for uterine fibroids    UTERINE FIBROID EMBOLIZATION      Current Outpatient Medications  Medication Sig Dispense Refill   estradiol  (CLIMARA ) 0.0375 mg/24hr patch Place 1 patch (0.0375 mg total) onto the skin once a week. 12 patch 2   progesterone  (PROMETRIUM ) 200 MG capsule Take one capsule (200 mg) by mouth at bedtime. 90 capsule 2   VITAMIN D  PO Take by mouth daily.     estradiol  (ESTRACE  VAGINAL) 0.1 MG/GM vaginal cream Place 1/2 gram per vagina at bedtime 2 - 3 nights per week. (Patient not taking: Reported on 12/15/2023) 42.5 g 2   No current facility-administered medications for this visit.    ALLERGIES: Meloxicam  and Sulfa  antibiotics  Family History  Problem Relation Age of Onset   Hypertension Mother    Liver cancer Mother    Stomach cancer Mother    Thyroid  disease Sister    Hypertension Maternal Grandmother    Heart attack Maternal Grandmother    Cancer Maternal  Grandfather    Prostate cancer Maternal Grandfather    BRCA 1/2 Neg Hx    Breast cancer Neg Hx     Review of Systems  All other systems reviewed and are negative.   PHYSICAL EXAM:  BP 114/76 (BP Location: Left Arm, Patient Position: Sitting)   Pulse (!) 101   Ht 5' 1.75 (1.568 m)   Wt 116 lb (52.6 kg)   LMP 02/23/2021 (Exact Date)   SpO2 97%   BMI 21.39 kg/m     General appearance: alert, cooperative and appears stated age Head: normocephalic, without obvious abnormality, atraumatic Neck: no adenopathy, supple, symmetrical, trachea midline and thyroid  normal to inspection and palpation Lungs: clear to auscultation bilaterally Breasts: normal appearance, no masses or tenderness, No nipple retraction or dimpling, No nipple discharge or bleeding, No axillary  adenopathy Heart: regular rate and rhythm Abdomen: soft, non-tender; no masses, no organomegaly Extremities: extremities normal, atraumatic, no cyanosis or edema Skin: skin color, texture, turgor normal. No rashes or lesions Lymph nodes: cervical, supraclavicular, and axillary nodes normal. Neurologic: grossly normal  Pelvic: External genitalia:  no lesions              No abnormal inguinal nodes palpated.              Urethra:  normal appearing urethra with no masses, tenderness or lesions              Bartholins and Skenes: normal                 Vagina: normal appearing vagina with normal color and discharge, no lesions              Cervix: no lesions              Pap taken: {yes no:314532} Bimanual Exam:  Uterus:  normal size, contour, position, consistency, mobility, non-tender              Adnexa: no mass, fullness, tenderness              Rectal exam: {yes no:314532}.  Confirms.              Anus:  normal sphincter tone, no lesions  Chaperone was present for exam:  {BSCHAPERONE:[id number]::Emily F, CMA}  ASSESSMENT: Well woman visit with gynecologic exam. Hx uterine artery embolization for fibroids.  HRT. PHQ-2-9: ***  ***  PLAN: Mammogram screening discussed. Self breast awareness reviewed. Pap and HRV collected:  {yes no:314532} Guidelines for Calcium, Vitamin D , regular exercise program including cardiovascular and weight bearing exercise. Medication refills:  *** {LABS (Optional):23779} Follow up:  ***    Additional counseling given.  {yes [id number]. ***  total time was spent for this patient encounter, including preparation, face-to-face counseling with the patient, coordination of care, and documentation of the encounter in addition to doing the well woman visit with gynecologic exam.

## 2023-12-16 ENCOUNTER — Ambulatory Visit: Payer: Self-pay | Admitting: Obstetrics and Gynecology

## 2023-12-16 LAB — CYTOLOGY - PAP
Adequacy: ABSENT
Comment: NEGATIVE
Diagnosis: NEGATIVE
High risk HPV: NEGATIVE

## 2024-02-11 ENCOUNTER — Telehealth: Payer: Self-pay | Admitting: *Deleted

## 2024-02-11 NOTE — Telephone Encounter (Signed)
 Call to set up PV and colonoscopy that is due. Unable to reach. LM with call back #

## 2024-03-12 ENCOUNTER — Encounter: Payer: Self-pay | Admitting: Internal Medicine

## 2024-03-25 ENCOUNTER — Encounter

## 2024-12-22 ENCOUNTER — Ambulatory Visit: Admitting: Obstetrics and Gynecology
